# Patient Record
Sex: Male | Born: 1965 | Race: White | Hispanic: No | Marital: Married | Smoking: Never smoker
Health system: Southern US, Community
[De-identification: ages and names within clinical notes are randomized; demographics above are authoritative.]

## PROBLEM LIST (undated history)

## (undated) DIAGNOSIS — F32A Depression, unspecified: Secondary | ICD-10-CM

## (undated) DIAGNOSIS — H9190 Unspecified hearing loss, unspecified ear: Secondary | ICD-10-CM

## (undated) DIAGNOSIS — F419 Anxiety disorder, unspecified: Secondary | ICD-10-CM

## (undated) DIAGNOSIS — G4733 Obstructive sleep apnea (adult) (pediatric): Secondary | ICD-10-CM

## (undated) DIAGNOSIS — E7800 Pure hypercholesterolemia, unspecified: Secondary | ICD-10-CM

## (undated) DIAGNOSIS — G720 Drug-induced myopathy: Secondary | ICD-10-CM

## (undated) DIAGNOSIS — E785 Hyperlipidemia, unspecified: Secondary | ICD-10-CM

## (undated) DIAGNOSIS — G3184 Mild cognitive impairment, so stated: Secondary | ICD-10-CM

## (undated) DIAGNOSIS — R319 Hematuria, unspecified: Secondary | ICD-10-CM

## (undated) DIAGNOSIS — J45909 Unspecified asthma, uncomplicated: Secondary | ICD-10-CM

## (undated) DIAGNOSIS — D126 Benign neoplasm of colon, unspecified: Secondary | ICD-10-CM

## (undated) HISTORY — PX: HERNIA REPAIR: SHX51

## (undated) HISTORY — DX: Unspecified hearing loss, unspecified ear: H91.90

## (undated) HISTORY — DX: Anxiety disorder, unspecified: F41.9

## (undated) HISTORY — DX: Pure hypercholesterolemia, unspecified: E78.00

## (undated) HISTORY — PX: ORIF TIBIA FRACTURE: SHX5416

## (undated) HISTORY — DX: Hyperlipidemia, unspecified: E78.5

## (undated) HISTORY — DX: Mild cognitive impairment of uncertain or unknown etiology: G31.84

## (undated) HISTORY — DX: Drug-induced myopathy: G72.0

## (undated) HISTORY — DX: Unspecified asthma, uncomplicated: J45.909

## (undated) HISTORY — DX: Obstructive sleep apnea (adult) (pediatric): G47.33

## (undated) HISTORY — PX: CHOLECYSTECTOMY: SHX55

## (undated) HISTORY — DX: Hematuria, unspecified: R31.9

## (undated) HISTORY — DX: Depression, unspecified: F32.A

## (undated) HISTORY — DX: Benign neoplasm of colon, unspecified: D12.6

---

## 2000-08-20 ENCOUNTER — Encounter: Payer: Self-pay | Admitting: Emergency Medicine

## 2000-08-20 ENCOUNTER — Inpatient Hospital Stay (HOSPITAL_COMMUNITY): Admission: EM | Admit: 2000-08-20 | Discharge: 2000-08-24 | Payer: Self-pay | Admitting: Emergency Medicine

## 2000-08-21 ENCOUNTER — Encounter: Payer: Self-pay | Admitting: Orthopedic Surgery

## 2004-03-11 ENCOUNTER — Encounter: Admission: RE | Admit: 2004-03-11 | Discharge: 2004-03-11 | Payer: Self-pay | Admitting: Otolaryngology

## 2008-02-29 ENCOUNTER — Ambulatory Visit (HOSPITAL_COMMUNITY): Admission: RE | Admit: 2008-02-29 | Discharge: 2008-02-29 | Payer: Self-pay | Admitting: Surgery

## 2008-05-09 ENCOUNTER — Encounter: Admission: RE | Admit: 2008-05-09 | Discharge: 2008-05-09 | Payer: Self-pay | Admitting: Surgery

## 2010-07-29 NOTE — Op Note (Signed)
Jesus [name], PHIMMASONE             ACCOUNT NO.:  [id number]   MEDICAL RECORD NO.:  [id number]          PATIENT TYPE:  AMB   LOCATION:  DAY                          FACILITY:  [facility]   PHYSICIAN:  Wilmon Arms. Corliss Skains, M.D. DATE OF BIRTH:  Jul 28, 1965   DATE OF PROCEDURE:  02/29/2008  DATE OF DISCHARGE:                               OPERATIVE REPORT   PREOPERATIVE DIAGNOSIS:  Left inguinal hernia.   POSTOPERATIVE DIAGNOSIS:  Left inguinal hernia.   PROCEDURE PERFORMED:  Left inguinal hernia repair with mesh.   SURGEON:  Wilmon Arms. Tsuei, M.D.   ANESTHESIA:  General endotracheal.   INDICATIONS:  The patient is a 45 year old male who recently underwent a  laparoscopic cholecystectomy for significant biliary dyskinesia and  chronic cholecystitis.  When he was in his recovery period after his  gallbladder surgery he began having some discomfort in his left groin.  At his postop visit he was examined in the office and he was felt to  have a left inguinal hernia which is reducible.  The patient now  presents for elective repair.   DESCRIPTION OF PROCEDURE:  The patient was brought to the operating room  and placed in the supine position on the operating room table.  After an  adequate level of general anesthesia was obtained the patient's left  groin was shaved, prepped with Betadine and draped in a sterile fashion.  A time-out was taken to assure the proper patient and proper procedure.  The area above the left inguinal ligament was infiltrated with 0.25%  Marcaine with epinephrine.  An oblique incision was made here.  Dissection was carried down to the subcutaneous tissues with cautery.  The external oblique fascia was exposed.  We opened the fascia along the  direction of its fibers down to the external ring.  Bluntly dissected  around the spermatic cord and retracted it with a Penrose drain.  The  floor of the inguinal canal showed no direct defect, it was very lax and  was bulging.   We skeletonized the spermatic cord and a fairly large  indirect hernia sac was dissected free and reduced up to the internal  ring.  The internal ring was then tightened with a 2-0 Vicryl suture.  The floor of the inguinal canal was reinforced with a running zero PDS  suture.  Three x 6 Ultrapro mesh was then cut into keyhole shape and  secured into the pubic tubercle with 2-0 Prolene suture.  Tails were  sutured together behind the cord and were tucked underneath the external  oblique fascia.  We secured the mesh to the shelving edge inferiorly and  the internal oblique fascia superiorly.  The fascia was then  reapproximated with 2-0 Vicryl.  Vicryl 3-0 was used to close  subcutaneous tissues and 4-0 Monocryl was used to close the skin.  Steri-  Strips and clean dressings were applied.  The patient was then extubated  and brought to the recovery room in stable condition.  All sponge,  instrument and needle counts were correct.      Wilmon Arms. Tsuei, M.D.  Electronically Signed  MKT/MEDQ  D:  02/29/2008  T:  03/01/2008  Job:  [id number]

## 2010-08-01 NOTE — Discharge Summary (Signed)
[place]. [facility]  Patient:    Jesus [name], Jesus [name]                    MRN: [id number] Adm. Date:  [id number] Disc. Date: [id number] Attending:  Carolan Shiver Ii Dictator:   Jamelle Rushing, P.A.                           Discharge Summary  ADMISSION DIAGNOSIS:  Right comminuted mid-shaft tibial fracture.  DISCHARGE DIAGNOSES:  1. Irrigation and debridement and open reduction internal fixation with an     intramedullary nailing of his right mid-shaft tibial fracture.  2. History of depression.  3. Urinary retention.  HISTORY OF PRESENT ILLNESS:  The patient is a 45 year old male who was working on his roof and the ladder fell off the roof.  The patient got tired of waiting for someone to come and assist him so he jumped off the [place] roof.  The patient landed on his right leg predominantly, had immediate pain and deformity, and noted a large bleeding wound on the anterior aspect of his lower right extremity.  The patient was able to attract attention and was able to be brought to the [facility] Emergency room by EMS in a C-collar and long leg splint.  ALLERGIES:  No known drug allergies.  CURRENT MEDICATIONS: 1. Remeron 30 mg p.o. q.d. 2. Effexor 150 mg 1 p.o. b.i.d.  SURGICAL PROCEDURE:  August 20, 2000, the patient was taken to the operating room by Dr. Anthoney Harada Rendall and assisted by Jamelle Rushing, P.A.  Under gener al anesthesia the patient had an open comminuted fracture of his right tibia and fibula.  The patient had an I&D and a IM nailing of this fracture with a top normal nail with proximal interlocking screw.  The patient tolerated this procedure well.  The patient was transferred to the recovery room and then to the orthopedic floor for further treatment.  CONSULTS:  Occupational and physical therapy routine consults.  HOSPITAL COURSE:  On August 20, 2000, the patient was admitted to [facility] under the care of  John L. Dorothyann Gibbs, M.D.  The patient was taken to the operating room where an open tibia/fibular fracture was repaired with an IM nailing and irrigation and debridement of his open wound was performed. The patient tolerated these procedures well, was transferred to the recovery room, and then to the orthopedic floor for rehab.  The patient then had a three-day postoperative course in which the patient had a moderate amount of discomfort in his right lower extremity initially which was improved with PCA.  The leg remained neurovascularly/motor intact during the patients entire hospital course.  The patient pain did improve his second and third day postoperative course, but upon removal of his Foley catheter on postoperative day two the patient was unable to void.  He was in and out catheterized a multiple amount of times and found to have a significant amount of retained urine.  So he was then placed on some Urecholine and eventually he was able to avoid on his own without any further complications.  The patient was able to void on his own on postoperative day three.  His wound remained benign and his leg remained neurovascularly/motor intact, so he was in fact discharged to home on postoperative day three in good condition.  X-RAYS:  Chest x-ray on admission found no acute pulmonary  findings.  Cervical spine series on admission found no acute bony findings and normal alignment. Right two-view tib/fib on admission shows comminuted displaced tib/fib fractures.  Postoperative x-rays of his right lower extremity showed intramedullary rod has been placed for fixation of comminuted distal right tibial fracture; comminuted fracture of the right distal fibula is also noted.  LABORATORY:  On August 23, 2000, CBC:  WBC 9.8, hemoglobin 12.1, hematocrit 34.2, platelets 256.  Routine chemistries on admission:  Sodium 141, potassium 3.7, glucose 122, BUN 22, creatinine 1.1.  Wound cultures from right  leg showed no WBCs, no organisms noted after two days.  DISCHARGE MEDICATIONS:  1. Tylenol 650 mg p.o. q.8h. p.r.n.  2. Aspirin 325 mg p.o. q.d.  3. Flonase 1 puff q.d.  4. Urecholine 25 mg p.o. t.i.d.  5. Klonopin 0.5 mg p.o. b.i.d.  6. Vicodin 5 mg 1-2 tablets q.4-6h. p.r.n. pain.  7. Claritin 10 mg p.o. q.d.  8. Robaxin 500 mg p.o. q.6-8h. p.r.n. spasms.  9. Remeron 30 mg p.o. q.d. 10. Singulair 10 mg p.o. q.h.s. 11. Percocet 1-2 tablets p.o. q.4-6h. breakthrough pain not covered by     Vicodin. 12. Effexor 150 mg p.o. b.i.d.  DISCHARGE INSTRUCTIONS:  The patient is to continue routine medications:  1. Keflex 500 mg 1 tablet 4 x q.d. for next 7 days.  2. Darvocet-N 100 1-2 tablets q.4-6h. p.r.n. pain if needed.  3. Aspirin 325 mg once a day.  ACTIVITY:  As tolerated, no weightbearing on right foot, touchdown for balance only.  No driving.  DIET:  No restrictions.  WOUND CARE:  The patient is to keep cast clean and dry.  Check wound on knee daily for any signs of infection.  FOLLOWUP:  The patient is to call [phone] for a followup appointment with Dr. Priscille Kluver this following Thursday.  CONDITION ON DISCHARGE:  Good. DD:  09/11/00 TD:  09/11/00 Job: [id number]

## 2010-08-01 NOTE — Op Note (Signed)
[place]. [facility]  Patient:    Jesus [name], Jesus [name]                    MRN: [id number] Proc. Date: 08/20/00 Adm. Date:  [id number] Attending:  Carolan Shiver Ii                           Operative Report  PREOPERATIVE DIAGNOSIS:  Open, comminuted fracture, right tibia and fibula.  SURGICAL PROCEDURE:  Irrigation and debridement, open tib-fib fracture, with IM rod, Top-normal nail with proximal interlock.  POSTOPERATIVE DIAGNOSIS:  Open, comminuted fracture, right tibia and fibula.  SURGEON:  John L. Margaretmary Lombard, M.D.  ASSISTANT:  Jamelle Rushing, P.A.  ANESTHESIA:  General.  PATHOLOGY:  The patient has a comminuted distal third tibia fracture with an open wound inside out, in which there is dirt on the leg as he landed in the dirt, but no gross dirt was seen on the spike of bone that passed through the skin.  PROCEDURE:  Under general anesthesia, the right leg was prepared with Betadine and draped as a sterile field.  The laceration in the skin was jagged at about 3 cm in length, and the skin edges were excised.  At this point, the fractured tibia was exposed.  The bone ends were Re-injured away.  Three liters of pressure irrigation was done into the break.  At this point, a mediosuperior incision was made, medial parapatellar, and the Redrafting tendon tibia was exposed beneath the fat pad.  A rasp was used to make a hole in the bone, and canal finders were passed.  A 12 mm step reamer was passed, and then an IM rod was passed down the proximal tibia, across the fracture site into the distal tibia.  The length of the tibia was measured at 34 mm.  At this point, it was necessary to pass sounds to see the diameter of the canal of the tibia.  An 8 mm passed relatively easily.  A 9 mm passed, but the 10 hung up fairly quickly.  The decision was made to use an 8 x 34.5 mm Ace IM nail.  This was passed across the fracture, and one locking screw was  placed from anteromedially.  The tibia fracture was felt to be Rational stable with this in place, and at this point, further Sabering of the wounds was done.  Two 0.25-inch Penrose drains were placed, one proximally and distally, at the site of the open wound.  The part that was opened was closed with skin staples. The original opening had skin sutures put in place but not pulled together, and the proximal IM nailing site was closed with 2-0 Vicryl and skin clips.  A sterile dressing and posterior short leg splint were applied.  The patient returned to recovery in good condition. DD:  08/21/00 TD:  08/22/00 Job: [id number]

## 2010-12-19 LAB — BASIC METABOLIC PANEL
BUN: 14 mg/dL (ref 6–23)
Calcium: 9.4 mg/dL (ref 8.4–10.5)
GFR calc non Af Amer: >60 mL/min (ref >60)
Glucose, Bld: 114 mg/dL — ABNORMAL HIGH (ref 70–99)
Potassium: 3.9 mEq/L (ref 3.5–5.1)

## 2010-12-19 LAB — DIFFERENTIAL
Basophils Absolute: 0 10*3/uL (ref 0.0–0.1)
Eosinophils Absolute: 0.1 10*3/uL (ref 0.0–0.7)
Eosinophils Relative: 2 % (ref 0–5)
Lymphocytes Relative: 27 % (ref 12–46)

## 2010-12-19 LAB — CBC
HCT: 39.4 % (ref 39.0–52.0)
Platelets: 267 10*3/uL (ref 150–400)
RDW: 12.6 % (ref 11.5–15.5)

## 2014-08-23 ENCOUNTER — Ambulatory Visit
Admission: RE | Admit: 2014-08-23 | Discharge: 2014-08-23 | Disposition: A | Discharge status: Home / Self Care | Payer: BC Managed Care – PPO | Source: Ambulatory Visit | Attending: Family Medicine | Admitting: Family Medicine

## 2014-08-23 ENCOUNTER — Other Ambulatory Visit: Payer: Self-pay | Admitting: Family Medicine

## 2014-08-23 DIAGNOSIS — R519 Headache, unspecified: Secondary | ICD-10-CM

## 2014-08-23 DIAGNOSIS — R51 Headache: Principal | ICD-10-CM

## 2014-08-23 DIAGNOSIS — S060X0A Concussion without loss of consciousness, initial encounter: Secondary | ICD-10-CM

## 2014-08-28 ENCOUNTER — Other Ambulatory Visit: Payer: Self-pay | Admitting: Family Medicine

## 2014-08-28 DIAGNOSIS — S060X9S Concussion with loss of consciousness of unspecified duration, sequela: Secondary | ICD-10-CM

## 2014-08-29 ENCOUNTER — Ambulatory Visit
Admission: RE | Admit: 2014-08-29 | Discharge: 2014-08-29 | Disposition: A | Discharge status: Home / Self Care | Payer: BC Managed Care – PPO | Source: Ambulatory Visit | Attending: Family Medicine | Admitting: Family Medicine

## 2014-08-29 DIAGNOSIS — S060X9S Concussion with loss of consciousness of unspecified duration, sequela: Secondary | ICD-10-CM

## 2014-10-04 ENCOUNTER — Ambulatory Visit (INDEPENDENT_AMBULATORY_CARE_PROVIDER_SITE_OTHER): Payer: BC Managed Care – PPO | Admitting: Neurology

## 2014-10-04 ENCOUNTER — Encounter: Payer: Self-pay | Admitting: Neurology

## 2014-10-04 VITALS — BP 136/80 | HR 72 | Resp 16 | Ht 72.0 in | Wt 209.0 lb

## 2014-10-04 DIAGNOSIS — F0781 Postconcussional syndrome: Secondary | ICD-10-CM

## 2014-10-04 DIAGNOSIS — F411 Generalized anxiety disorder: Secondary | ICD-10-CM | POA: Diagnosis not present

## 2014-10-04 MED ORDER — AMITRIPTYLINE HCL 10 MG PO TABS: ORAL_TABLET | ORAL | Status: DC

## 2014-10-04 NOTE — Patient Instructions (Signed)
1. Start amitriptyline : Take 1 tablet at night for 1 week, then increase to 2 tablets at night 2. Recommend starting therapy for anxiety 3. Physical and brain rest are of utmost importance after a concussion 4. Follow-up in 3 weeks, call for any problems
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
## 2014-10-07 ENCOUNTER — Encounter: Payer: Self-pay | Admitting: Neurology

## 2014-10-07 DIAGNOSIS — F411 Generalized anxiety disorder: Secondary | ICD-10-CM | POA: Insufficient documentation

## 2014-10-24 ENCOUNTER — Encounter: Payer: Self-pay | Admitting: Neurology

## 2014-10-24 ENCOUNTER — Ambulatory Visit (INDEPENDENT_AMBULATORY_CARE_PROVIDER_SITE_OTHER): Payer: BC Managed Care – PPO | Admitting: Neurology

## 2014-10-24 VITALS — BP 122/88 | HR 88 | Ht 70.0 in | Wt 206.0 lb

## 2014-10-24 DIAGNOSIS — F411 Generalized anxiety disorder: Secondary | ICD-10-CM | POA: Diagnosis not present

## 2014-10-24 DIAGNOSIS — F0781 Postconcussional syndrome: Secondary | ICD-10-CM | POA: Diagnosis not present

## 2014-10-24 MED ORDER — AMITRIPTYLINE HCL 10 MG PO TABS: ORAL_TABLET | ORAL | Status: DC

## 2014-10-24 NOTE — Patient Instructions (Signed)
1. Continue with psychotherapy, discuss Cognitive Behavioral Therapy with Mr. Jesus [name] 2. Increase amitriptyline : take 3 tabs at bedtime 3. Continue with physical and cognitive rest 4. Follow-up in 2 months
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
## 2014-10-25 ENCOUNTER — Ambulatory Visit: Payer: BC Managed Care – PPO | Admitting: Neurology

## 2014-10-29 ENCOUNTER — Telehealth: Payer: Self-pay | Admitting: Neurology

## 2014-10-29 NOTE — Telephone Encounter (Signed)
Returned call. Told him that form wasn't ready yet & I would call him when form was completed. Explained that we do have a 5-7 day turn around for form completion.

## 2014-10-29 NOTE — Telephone Encounter (Signed)
Pt was checking to see if his short term disability forms are ready for pick up? Call back @  [phone]

## 2014-12-26 ENCOUNTER — Ambulatory Visit (INDEPENDENT_AMBULATORY_CARE_PROVIDER_SITE_OTHER): Payer: BC Managed Care – PPO | Admitting: Neurology

## 2014-12-26 ENCOUNTER — Encounter: Payer: Self-pay | Admitting: Neurology

## 2014-12-26 VITALS — BP 122/92 | HR 82 | Ht 70.0 in | Wt 214.0 lb

## 2014-12-26 DIAGNOSIS — F411 Generalized anxiety disorder: Secondary | ICD-10-CM

## 2014-12-26 DIAGNOSIS — F0781 Postconcussional syndrome: Secondary | ICD-10-CM | POA: Diagnosis not present

## 2014-12-26 MED ORDER — AMITRIPTYLINE HCL 25 MG PO TABS: ORAL_TABLET | ORAL | Status: DC
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
## 2014-12-26 NOTE — Patient Instructions (Signed)
1. Increase amitriptyline 10mg : Take 4 capsules at night. Once you finish your current bottle, your new prescription will be for amitriptyline 25mg : Take 2 tabs at night 2. Call Dr. Cliffton AstersWhite for refills on Xanax until you see Dr. Evelene CroonKaur 3. Refer to Dr. Evelene CroonKaur for treatment of anxiety 4. Follow-up in 2 months

## 2015-02-12 ENCOUNTER — Telehealth: Payer: Self-pay | Admitting: Neurology

## 2015-02-12 NOTE — Telephone Encounter (Signed)
VM-PT left message in regards to some disability forms/Dawn CB# [phone]

## 2015-02-12 NOTE — Telephone Encounter (Signed)
Patient stopped by the office and I spoke with him in person about disability forms. Patient picked up forms he had dropped off last week as they were the wrong forms. He is going to get the correct short term disability forms and drop them off.

## 2015-02-25 ENCOUNTER — Encounter: Payer: Self-pay | Admitting: Neurology

## 2015-02-25 ENCOUNTER — Ambulatory Visit (INDEPENDENT_AMBULATORY_CARE_PROVIDER_SITE_OTHER): Payer: BC Managed Care – PPO | Admitting: Neurology

## 2015-02-25 VITALS — BP 132/78 | HR 98 | Ht 70.0 in | Wt 218.0 lb

## 2015-02-25 DIAGNOSIS — F4310 Post-traumatic stress disorder, unspecified: Secondary | ICD-10-CM | POA: Diagnosis not present

## 2015-02-25 DIAGNOSIS — F0781 Postconcussional syndrome: Secondary | ICD-10-CM | POA: Diagnosis not present

## 2015-02-25 DIAGNOSIS — F411 Generalized anxiety disorder: Secondary | ICD-10-CM | POA: Diagnosis not present

## 2015-02-25 MED ORDER — AMITRIPTYLINE HCL 25 MG PO TABS: ORAL_TABLET | ORAL | Status: DC
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
## 2015-02-25 NOTE — Patient Instructions (Addendum)
1. Reduce amitriptyline 25mg : take 1 tablet at night 2. Please call our office to let us know when start of spring semester is 3. Continue Propranolol and Xanax as prescribed by Dr. Evelene CroonKaur 4. Continue working with psychiatry and therapy 5. Recommend gradual return to regular daily activities 6. Follow-up in 4 months, call for any changes

## 2015-03-08 ENCOUNTER — Telehealth: Payer: Self-pay | Admitting: Neurology

## 2015-03-08 NOTE — Telephone Encounter (Signed)
PT called and wanted to know if the disability paper work was ready for pick up/Dawn CB# [phone]

## 2015-03-12 ENCOUNTER — Telehealth: Payer: Self-pay | Admitting: Neurology

## 2015-03-12 NOTE — Telephone Encounter (Signed)
PT called and asked if the paperwork was ready that he dropped off/Dawn CB# [phone] or [phone]

## 2015-03-12 NOTE — Telephone Encounter (Signed)
Pt wanting to know the status of the paper work please call [phone]

## 2015-03-12 NOTE — Telephone Encounter (Signed)
Do you still have his paperwork?

## 2015-03-13 NOTE — Telephone Encounter (Signed)
Called patient and lmovm that forms were ready, we could fax or he could pick them up. Also notified of $25.00 from completion fee.

## 2015-03-13 NOTE — Telephone Encounter (Signed)
Done, thanks

## 2015-03-14 DIAGNOSIS — Z0279 Encounter for issue of other medical certificate: Secondary | ICD-10-CM

## 2015-04-22 ENCOUNTER — Telehealth: Payer: Self-pay | Admitting: Neurology

## 2015-04-22 NOTE — Telephone Encounter (Signed)
Returned call. Patient states he had called to make sure we received his paperwork. I did tell him that we did get his forms, I will call him once forms are completed.

## 2015-04-22 NOTE — Telephone Encounter (Signed)
PT called and wanted to know if the disability paperwork was ready yet/Dawn  CB# [phone]

## 2015-04-29 ENCOUNTER — Telehealth: Payer: Self-pay | Admitting: Neurology
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
## 2015-04-30 ENCOUNTER — Telehealth: Payer: Self-pay | Admitting: Neurology

## 2015-04-30 DIAGNOSIS — Z0279 Encounter for issue of other medical certificate: Secondary | ICD-10-CM

## 2015-04-30 NOTE — Telephone Encounter (Signed)
Called patient and left msg that forms were ready for p/u & $25 form fee would need to be paid.

## 2015-04-30 NOTE — Telephone Encounter (Signed)
PT called and was asking if the paperwork was ready for pick up yet/Dawn [phone]

## 2015-06-17 ENCOUNTER — Telehealth: Payer: Self-pay | Admitting: Neurology

## 2015-06-17 NOTE — Telephone Encounter (Signed)
Pat called and asked if paperwork is ready for pick up.  Please call.

## 2015-06-18 NOTE — Telephone Encounter (Signed)
Called patient to let him know that he can pick up his form tomorrow morning.

## 2015-07-01 ENCOUNTER — Ambulatory Visit (INDEPENDENT_AMBULATORY_CARE_PROVIDER_SITE_OTHER): Payer: BC Managed Care – PPO | Admitting: Neurology

## 2015-07-01 ENCOUNTER — Encounter: Payer: Self-pay | Admitting: Neurology

## 2015-07-01 VITALS — BP 120/86 | HR 79 | Ht 70.0 in | Wt 217.0 lb

## 2015-07-01 DIAGNOSIS — F411 Generalized anxiety disorder: Secondary | ICD-10-CM

## 2015-07-01 DIAGNOSIS — F0781 Postconcussional syndrome: Secondary | ICD-10-CM | POA: Diagnosis not present

## 2015-07-01 NOTE — Patient Instructions (Signed)
1. Continue all your medications 2. Continue working with Dr. Evelene CroonKaur and your therapist 3. Follow-up in 5 months
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
## 2015-07-02 ENCOUNTER — Encounter: Payer: Self-pay | Admitting: Neurology

## 2015-12-02 ENCOUNTER — Ambulatory Visit (INDEPENDENT_AMBULATORY_CARE_PROVIDER_SITE_OTHER): Payer: BC Managed Care – PPO | Admitting: Neurology

## 2015-12-02 ENCOUNTER — Encounter: Payer: Self-pay | Admitting: Neurology

## 2015-12-02 VITALS — BP 144/84 | HR 87 | Temp 98.0°F | Ht 70.0 in | Wt 214.4 lb

## 2015-12-02 DIAGNOSIS — F0781 Postconcussional syndrome: Secondary | ICD-10-CM | POA: Diagnosis not present

## 2015-12-02 DIAGNOSIS — F411 Generalized anxiety disorder: Secondary | ICD-10-CM
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
## 2015-12-02 NOTE — Patient Instructions (Signed)
You continue to look better and better! Follow-up in 6 months, call for any changes
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
## 2016-06-17 ENCOUNTER — Ambulatory Visit (INDEPENDENT_AMBULATORY_CARE_PROVIDER_SITE_OTHER): Payer: BC Managed Care – PPO | Admitting: Neurology

## 2016-06-17 ENCOUNTER — Encounter: Payer: Self-pay | Admitting: Neurology

## 2016-06-17 VITALS — BP 110/76 | HR 81 | Temp 98.3°F | Resp 16 | Ht 70.0 in | Wt 203.4 lb

## 2016-06-17 DIAGNOSIS — F0781 Postconcussional syndrome: Secondary | ICD-10-CM | POA: Diagnosis not present

## 2016-06-17 NOTE — Patient Instructions (Signed)
1. Continue follow-up with Dr. Evelene Croon 2. Continue to monitor headaches, if worsening call our office 3. Follow-up in 1 year, call for any changes
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
## 2016-06-18 NOTE — Progress Notes (Signed)
Forward follow up office note to Dr. Evelene Croon.

## 2017-06-18 ENCOUNTER — Ambulatory Visit: Payer: BC Managed Care – PPO | Admitting: Neurology

## 2017-06-18 ENCOUNTER — Other Ambulatory Visit: Payer: Self-pay

## 2017-06-18 ENCOUNTER — Encounter: Payer: Self-pay | Admitting: Neurology

## 2017-06-18 VITALS — BP 106/70 | HR 62 | Ht 70.0 in | Wt 208.0 lb

## 2017-06-18 DIAGNOSIS — F411 Generalized anxiety disorder: Secondary | ICD-10-CM | POA: Diagnosis not present

## 2017-06-18 DIAGNOSIS — F0781 Postconcussional syndrome: Secondary | ICD-10-CM | POA: Diagnosis not present
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
## 2017-06-18 NOTE — Patient Instructions (Signed)
Repeat Neurocognitive testing can be done to compare to prior test. Continue follow-up with Dr. Evelene CroonKaur and Evette CristalKenneth Frazier. Follow-up in 1 year, call for any changes.

## 2018-04-19 ENCOUNTER — Telehealth: Payer: Self-pay

## 2018-04-19 NOTE — Telephone Encounter (Signed)
Opened in error

## 2018-06-15 ENCOUNTER — Other Ambulatory Visit: Payer: Self-pay

## 2018-06-15 ENCOUNTER — Telehealth (INDEPENDENT_AMBULATORY_CARE_PROVIDER_SITE_OTHER): Payer: BC Managed Care – PPO | Admitting: Neurology

## 2018-06-15 ENCOUNTER — Telehealth: Payer: Self-pay | Admitting: Neurology

## 2018-06-15 DIAGNOSIS — E785 Hyperlipidemia, unspecified: Secondary | ICD-10-CM | POA: Diagnosis not present

## 2018-06-15 NOTE — Telephone Encounter (Signed)
Patient called back to give blood pressure and pulse rate after e visit  Blood pressure 134/91 and pulse 61
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
## 2018-06-20 ENCOUNTER — Ambulatory Visit: Payer: BC Managed Care – PPO | Admitting: Neurology

## 2019-06-15 ENCOUNTER — Ambulatory Visit: Payer: BC Managed Care – PPO | Admitting: Neurology

## 2019-06-15 ENCOUNTER — Ambulatory Visit: Payer: BC Managed Care – PPO | Attending: Internal Medicine

## 2019-06-15 DIAGNOSIS — Z23 Encounter for immunization: Secondary | ICD-10-CM

## 2019-06-15 NOTE — Progress Notes (Signed)
   Covid-19 Vaccination Clinic  Name:  KEAHI MCCARNEY    MRN: [id number] DOB: 03-31-1965  06/15/2019  Mr. Livsey was observed post Covid-19 immunization for 15 minutes without incident. He was provided with Vaccine Information Sheet and instruction to access the V-Safe system.   Mr. Cass was instructed to call 911 with any severe reactions post vaccine: [name] Difficulty breathing  . Swelling of face and throat  . A fast heartbeat  . A bad rash all over body  . Dizziness and weakness   Immunizations Administered    Name Date Dose VIS Date Route   Pfizer COVID-19 Vaccine 06/15/2019  1:14 PM 0.3 mL 02/24/2019 Intramuscular   Manufacturer: [employer], [employer]   Lot: [id number]   NDC: [address]-9

## 2019-07-10 ENCOUNTER — Ambulatory Visit: Payer: BC Managed Care – PPO | Attending: Internal Medicine

## 2019-07-10 DIAGNOSIS — Z23 Encounter for immunization: Secondary | ICD-10-CM

## 2019-07-10 NOTE — Progress Notes (Signed)
   Covid-19 Vaccination Clinic  Name:  Jesus [name]    MRN: [id number] DOB: Apr 28, 1965  07/10/2019  Jesus [name] was observed post Covid-19 immunization for 15 minutes without incident. He was provided with Vaccine Information Sheet and instruction to access the V-Safe system.   Jesus [name] was instructed to call 911 with any severe reactions post vaccine: [name] Difficulty breathing  . Swelling of face and throat  . A fast heartbeat  . A bad rash all over body  . Dizziness and weakness   Immunizations Administered    Name Date Dose VIS Date Route   Pfizer COVID-19 Vaccine 07/10/2019  1:07 PM 0.3 mL 05/10/2018 Intramuscular   Manufacturer: [employer], [employer]   Lot: [id number]   NDC: [address]-8

## 2021-12-19 DIAGNOSIS — F4322 Adjustment disorder with anxiety: Secondary | ICD-10-CM | POA: Diagnosis not present

## 2021-12-26 DIAGNOSIS — G4733 Obstructive sleep apnea (adult) (pediatric): Secondary | ICD-10-CM | POA: Diagnosis not present

## 2022-01-05 DIAGNOSIS — Z23 Encounter for immunization: Secondary | ICD-10-CM | POA: Diagnosis not present

## 2022-01-06 DIAGNOSIS — G4733 Obstructive sleep apnea (adult) (pediatric): Secondary | ICD-10-CM | POA: Diagnosis not present

## 2022-01-16 DIAGNOSIS — F4322 Adjustment disorder with anxiety: Secondary | ICD-10-CM | POA: Diagnosis not present

## 2022-03-04 DIAGNOSIS — Z961 Presence of intraocular lens: Secondary | ICD-10-CM | POA: Diagnosis not present

## 2022-03-04 DIAGNOSIS — H11443 Conjunctival cysts, bilateral: Secondary | ICD-10-CM | POA: Diagnosis not present

## 2022-03-20 DIAGNOSIS — F4310 Post-traumatic stress disorder, unspecified: Secondary | ICD-10-CM | POA: Diagnosis not present

## 2022-03-20 DIAGNOSIS — F900 Attention-deficit hyperactivity disorder, predominantly inattentive type: Secondary | ICD-10-CM | POA: Diagnosis not present

## 2022-07-06 ENCOUNTER — Ambulatory Visit: Payer: PPO | Admitting: Psychiatry

## 2022-09-30 DIAGNOSIS — K5900 Constipation, unspecified: Secondary | ICD-10-CM | POA: Diagnosis not present

## 2022-09-30 DIAGNOSIS — Z0000 Encounter for general adult medical examination without abnormal findings: Secondary | ICD-10-CM | POA: Diagnosis not present

## 2022-09-30 DIAGNOSIS — J301 Allergic rhinitis due to pollen: Secondary | ICD-10-CM | POA: Diagnosis not present

## 2022-09-30 DIAGNOSIS — Z9989 Dependence on other enabling machines and devices: Secondary | ICD-10-CM | POA: Diagnosis not present

## 2022-09-30 DIAGNOSIS — F419 Anxiety disorder, unspecified: Secondary | ICD-10-CM | POA: Diagnosis not present

## 2022-09-30 DIAGNOSIS — G3184 Mild cognitive impairment, so stated: Secondary | ICD-10-CM | POA: Diagnosis not present

## 2022-09-30 DIAGNOSIS — R1310 Dysphagia, unspecified: Secondary | ICD-10-CM | POA: Diagnosis not present

## 2022-09-30 DIAGNOSIS — G4733 Obstructive sleep apnea (adult) (pediatric): Secondary | ICD-10-CM | POA: Diagnosis not present

## 2022-09-30 DIAGNOSIS — E785 Hyperlipidemia, unspecified: Secondary | ICD-10-CM | POA: Diagnosis not present

## 2022-09-30 DIAGNOSIS — H9193 Unspecified hearing loss, bilateral: Secondary | ICD-10-CM | POA: Diagnosis not present

## 2022-09-30 DIAGNOSIS — Z125 Encounter for screening for malignant neoplasm of prostate: Secondary | ICD-10-CM | POA: Diagnosis not present

## 2022-10-20 DIAGNOSIS — H9313 Tinnitus, bilateral: Secondary | ICD-10-CM | POA: Diagnosis not present

## 2022-10-20 DIAGNOSIS — Z822 Family history of deafness and hearing loss: Secondary | ICD-10-CM | POA: Diagnosis not present

## 2022-10-20 DIAGNOSIS — H903 Sensorineural hearing loss, bilateral: Secondary | ICD-10-CM | POA: Diagnosis not present

## 2022-11-18 DIAGNOSIS — E785 Hyperlipidemia, unspecified: Secondary | ICD-10-CM | POA: Diagnosis not present

## 2022-11-18 DIAGNOSIS — Z79899 Other long term (current) drug therapy: Secondary | ICD-10-CM | POA: Diagnosis not present

## 2023-01-12 DIAGNOSIS — G4733 Obstructive sleep apnea (adult) (pediatric): Secondary | ICD-10-CM | POA: Diagnosis not present

## 2023-01-12 DIAGNOSIS — Z79899 Other long term (current) drug therapy: Secondary | ICD-10-CM | POA: Diagnosis not present

## 2023-01-12 DIAGNOSIS — E785 Hyperlipidemia, unspecified: Secondary | ICD-10-CM | POA: Diagnosis not present

## 2023-01-18 DIAGNOSIS — R319 Hematuria, unspecified: Secondary | ICD-10-CM | POA: Diagnosis not present

## 2023-01-18 DIAGNOSIS — R300 Dysuria: Secondary | ICD-10-CM | POA: Diagnosis not present

## 2023-03-15 DIAGNOSIS — H11443 Conjunctival cysts, bilateral: Secondary | ICD-10-CM | POA: Diagnosis not present

## 2023-03-15 DIAGNOSIS — Z961 Presence of intraocular lens: Secondary | ICD-10-CM | POA: Diagnosis not present

## 2023-03-18 DIAGNOSIS — R310 Gross hematuria: Secondary | ICD-10-CM | POA: Diagnosis not present

## 2023-04-08 ENCOUNTER — Other Ambulatory Visit (HOSPITAL_BASED_OUTPATIENT_CLINIC_OR_DEPARTMENT_OTHER): Payer: Self-pay | Admitting: Family Medicine

## 2023-04-08 DIAGNOSIS — E785 Hyperlipidemia, unspecified: Secondary | ICD-10-CM

## 2023-04-09 DIAGNOSIS — N281 Cyst of kidney, acquired: Secondary | ICD-10-CM | POA: Diagnosis not present

## 2023-04-09 DIAGNOSIS — R310 Gross hematuria: Secondary | ICD-10-CM | POA: Diagnosis not present

## 2023-04-16 DIAGNOSIS — E785 Hyperlipidemia, unspecified: Secondary | ICD-10-CM | POA: Diagnosis not present

## 2023-04-26 ENCOUNTER — Ambulatory Visit (HOSPITAL_COMMUNITY)
Admission: RE | Admit: 2023-04-26 | Discharge: 2023-04-26 | Disposition: A | Discharge status: Home / Self Care | Payer: Self-pay | Source: Ambulatory Visit | Attending: Family Medicine | Admitting: Family Medicine

## 2023-04-26 DIAGNOSIS — E785 Hyperlipidemia, unspecified: Secondary | ICD-10-CM

## 2023-05-05 ENCOUNTER — Ambulatory Visit: Payer: PPO | Attending: Cardiology | Admitting: Cardiology

## 2023-05-05 ENCOUNTER — Encounter: Payer: Self-pay | Admitting: *Deleted

## 2023-05-05 ENCOUNTER — Encounter: Payer: Self-pay | Admitting: Cardiology

## 2023-05-05 VITALS — BP 130/80 | HR 67 | Resp 16 | Ht 70.0 in | Wt 209.8 lb

## 2023-05-05 DIAGNOSIS — R931 Abnormal findings on diagnostic imaging of heart and coronary circulation: Secondary | ICD-10-CM

## 2023-05-05 DIAGNOSIS — E782 Mixed hyperlipidemia: Secondary | ICD-10-CM | POA: Diagnosis not present

## 2023-05-05 DIAGNOSIS — E673 Hypervitaminosis D: Secondary | ICD-10-CM | POA: Diagnosis not present

## 2023-05-05 MED ORDER — EZETIMIBE 10 MG PO TABS: 10.0000 mg | ORAL_TABLET | Freq: Every day | ORAL | 3 refills | Status: DC

## 2023-05-05 MED ORDER — ATORVASTATIN CALCIUM 20 MG PO TABS: 20.0000 mg | ORAL_TABLET | Freq: Every day | ORAL | 2 refills | Status: DC

## 2023-05-05 MED ORDER — ASPIRIN 81 MG PO CHEW: 81.0000 mg | CHEWABLE_TABLET | Freq: Every day | ORAL | Status: DC

## 2023-05-05 NOTE — Patient Instructions (Signed)
Medication Instructions:   Start Atorvastatin 20 mg by mouth daily  Start Zetia 10 mg by mouth daily  Start aspirin 81 mg by mouth daily  *If you need a refill on your cardiac medications before your next appointment, please call your pharmacy*   Lab Work: Have lab work checked at [facility]  on the first floor today (Vitamin D) Have lab work checked a few days prior to appointment with Dr Jacinto Halim.  This will be fasting.  Lipid profile.  Can be done at any [facility] If you have labs (blood work) drawn today and your tests are completely normal, you will receive your results only by: MyChart Message (if you have MyChart) OR A paper copy in the mail If you have any lab test that is abnormal or we need to change your treatment, we will call you to review the results.   Testing/Procedures: Your physician has requested that you have an exercise stress myoview. For further information please visit [email or web address] Please follow instruction sheet, as given.    Follow-Up: At [facility], you and your health needs are our priority.  As part of our continuing mission to provide you with exceptional heart care, we have created designated Provider Care Teams.  These Care Teams include your primary Cardiologist (physician) and Advanced Practice Providers (APPs -  Physician Assistants and Nurse Practitioners) who all work together to provide you with the care you need, when you need it.  We recommend signing up for the patient portal called "MyChart".  Sign up information is provided on this After Visit Summary.  MyChart is used to connect with patients for Virtual Visits (Telemedicine).  Patients are able to view lab/test results, encounter notes, upcoming appointments, etc.  Non-urgent messages can be sent to your provider as well.   To learn more about what you can do with MyChart, go to [email or web address].    Your next appointment:   10 week(s)  Provider:   Yates Decamp, MD     Other  Instructions
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
## 2023-05-06 ENCOUNTER — Encounter: Payer: Self-pay | Admitting: Cardiology

## 2023-05-06 LAB — VITAMIN D 25 HYDROXY (VIT D DEFICIENCY, FRACTURES): Vit D, 25-Hydroxy: 45.9 ng/mL (ref 30.0–100.0)

## 2023-05-06 NOTE — Progress Notes (Signed)
Normal Vit D. He should probably add CoQ 10 supplement to see if myalgia would be helped with statin if he has not done so.

## 2023-05-07 ENCOUNTER — Telehealth: Payer: Self-pay | Admitting: Cardiology

## 2023-05-07 ENCOUNTER — Encounter (HOSPITAL_COMMUNITY): Payer: Self-pay

## 2023-05-07 NOTE — Telephone Encounter (Signed)
Patient  is requesting all results and messages given to him via phone call and not through MyChart.

## 2023-05-07 NOTE — Telephone Encounter (Signed)
Spoke with patient and he stated that communicating through [place] is fine. He doesn't like text messages. States he received a text to give [place] a call. He states he does not check his text messages often

## 2023-05-14 ENCOUNTER — Ambulatory Visit (HOSPITAL_COMMUNITY): Payer: PPO | Attending: Cardiology

## 2023-05-14 DIAGNOSIS — R931 Abnormal findings on diagnostic imaging of heart and coronary circulation: Secondary | ICD-10-CM | POA: Diagnosis present

## 2023-05-14 DIAGNOSIS — E782 Mixed hyperlipidemia: Secondary | ICD-10-CM | POA: Insufficient documentation

## 2023-05-14 DIAGNOSIS — R9431 Abnormal electrocardiogram [ECG] [EKG]: Secondary | ICD-10-CM | POA: Insufficient documentation

## 2023-05-14 LAB — MYOCARDIAL PERFUSION IMAGING
Angina Index: 0
Duke Treadmill Score: -10
Estimated workload: 7
Exercise duration (min): 5 min
Exercise duration (sec): 1 s
LV dias vol: 89 mL (ref 62–150)
LV sys vol: 50 mL
MPHR: 161 {beats}/min
Nuc Stress EF: 44 %
Peak HR: 148 {beats}/min
Percent HR: 91 %
Rest HR: 73 {beats}/min
Rest Nuclear Isotope Dose: 10.1 mCi
SDS: 2
SRS: 0
SSS: 2
ST Depression (mm): 3 mm
Stress Nuclear Isotope Dose: 32.7 mCi
TID: 1.03

## 2023-05-14 MED ORDER — TECHNETIUM TC 99M TETROFOSMIN IV KIT
10.1000 | PACK | Freq: Once | INTRAVENOUS | Status: AC | PRN
  Administered 2023-05-14: 10.1 via INTRAVENOUS

## 2023-05-14 MED ORDER — TECHNETIUM TC 99M TETROFOSMIN IV KIT
32.7000 | PACK | Freq: Once | INTRAVENOUS | Status: AC | PRN
  Administered 2023-05-14: 32.7 via INTRAVENOUS

## 2023-05-15 ENCOUNTER — Encounter: Payer: Self-pay | Admitting: Cardiology

## 2023-05-15 NOTE — Progress Notes (Signed)
 Abnormal EKG response and although perfusion is normal, I suspect ischemic cardiomyopathy. I want to schedule him for echo and I will discuss  if he should proceed with cardiac cath especially given his young age.  Please order Echo. Indication CAD with other forms of angina

## 2023-05-16 ENCOUNTER — Encounter: Payer: Self-pay | Admitting: Cardiology

## 2023-05-17 ENCOUNTER — Other Ambulatory Visit: Payer: Self-pay | Admitting: *Deleted

## 2023-05-17 DIAGNOSIS — I25118 Atherosclerotic heart disease of native coronary artery with other forms of angina pectoris: Secondary | ICD-10-CM

## 2023-05-18 ENCOUNTER — Ambulatory Visit (HOSPITAL_COMMUNITY): Attending: Cardiology

## 2023-05-18 ENCOUNTER — Encounter: Payer: Self-pay | Admitting: Cardiology

## 2023-05-18 DIAGNOSIS — I25118 Atherosclerotic heart disease of native coronary artery with other forms of angina pectoris: Secondary | ICD-10-CM | POA: Insufficient documentation

## 2023-05-18 LAB — ECHOCARDIOGRAM COMPLETE
Area-P 1/2: 2.91 cm2
S' Lateral: 3.2 cm

## 2023-05-18 NOTE — Progress Notes (Signed)
 Normal echocardiogram, very borderline dilatation of aortic root.

## 2023-05-21 ENCOUNTER — Encounter: Payer: Self-pay | Admitting: Cardiology

## 2023-05-21 NOTE — Telephone Encounter (Signed)
 Please schedule visit with me to follow uo on stress and echo in 3 weeks or so

## 2023-05-25 ENCOUNTER — Other Ambulatory Visit: Payer: Self-pay

## 2023-05-25 ENCOUNTER — Emergency Department (HOSPITAL_COMMUNITY)

## 2023-05-25 ENCOUNTER — Emergency Department (HOSPITAL_COMMUNITY): Admission: EM | Admit: 2023-05-25 | Discharge: 2023-05-26 | Disposition: A | Discharge status: Home / Self Care

## 2023-05-25 DIAGNOSIS — R079 Chest pain, unspecified: Secondary | ICD-10-CM | POA: Diagnosis not present

## 2023-05-25 DIAGNOSIS — R42 Dizziness and giddiness: Secondary | ICD-10-CM | POA: Insufficient documentation

## 2023-05-25 DIAGNOSIS — R0789 Other chest pain: Secondary | ICD-10-CM | POA: Diagnosis not present

## 2023-05-25 DIAGNOSIS — F419 Anxiety disorder, unspecified: Secondary | ICD-10-CM | POA: Diagnosis not present

## 2023-05-25 DIAGNOSIS — Z7982 Long term (current) use of aspirin: Secondary | ICD-10-CM | POA: Diagnosis not present

## 2023-05-25 DIAGNOSIS — I10 Essential (primary) hypertension: Secondary | ICD-10-CM | POA: Diagnosis not present

## 2023-05-25 LAB — BASIC METABOLIC PANEL
Anion gap: 11 (ref 5–15)
BUN: 9 mg/dL (ref 6–20)
CO2: 23 mmol/L (ref 22–32)
Calcium: 9.8 mg/dL (ref 8.9–10.3)
Chloride: 102 mmol/L (ref 98–111)
Creatinine, Ser: 1.08 mg/dL (ref 0.61–1.24)
GFR, Estimated: >60 mL/min (ref >60)
Glucose, Bld: 91 mg/dL (ref 70–99)
Potassium: 3.6 mmol/L (ref 3.5–5.1)
Sodium: 136 mmol/L (ref 135–145)

## 2023-05-25 LAB — CBC
HCT: 41 % (ref 39.0–52.0)
Hemoglobin: 14.6 g/dL (ref 13.0–17.0)
MCH: 32.9 pg (ref 26.0–34.0)
MCHC: 35.6 g/dL (ref 30.0–36.0)
MCV: 92.3 fL (ref 80.0–100.0)
Platelets: 241 10*3/uL (ref 150–400)
RBC: 4.44 MIL/uL (ref 4.22–5.81)
RDW: 11.5 % (ref 11.5–15.5)
WBC: 5.6 10*3/uL (ref 4.0–10.5)
nRBC: 0 % (ref 0.0–0.2)

## 2023-05-25 LAB — I-STAT CG4 LACTIC ACID, ED: Lactic Acid, Venous: 0.8 mmol/L (ref 0.5–1.9)

## 2023-05-25 LAB — TROPONIN I (HIGH SENSITIVITY): Troponin I (High Sensitivity): 4 ng/L (ref <18)

## 2023-05-25 NOTE — ED Provider Notes (Signed)
 [place] EMERGENCY DEPARTMENT AT [facility] Provider Note   CSN: [id number] Arrival date & time: 05/25/23  2100     History {Add pertinent medical, surgical, social history, OB history to HPI:1} Chief Complaint  Patient presents with   Chest Pain    Jesus [name] is a 58 y.o. male.  Patient with past medical history significant for HLD presenting for chest pain. Patient states that the pain began around 7pm shortly after taking the trash to the curb, which has never occurred before. He reports that this is the worst he has felt in his life and experienced anxiety and lightheadedness in conjunction with the pain. He states the pain was approximately left of center of his chest and characterized the pain as a "poking" sensation. He is unsure if sitting down helped alleviate the pain but feels leaning back may have provided some relief. Pain is rated a 5/10 during the initial episode and was rated 2/10 during the encounter. Endorses indigestion and heartburn that occurred throughout the day, as well as some confusion when answering questions from EMS. Patient was exercising yesterday (walking/moving wood) and did not experience any pain. Pertinent PMH history includes recent CAC scan with score of 1661 which he was put on lipid lowering medications, anxiety, and PTSD. As of one month ago the patient stopped drinking where he previously drank about one beer per day. Denies smoking or any other drug use.    Chest Pain      Home Medications Prior to Admission medications   Medication Sig Start Date End Date Taking? Authorizing Provider  ALPRAZolam Prudy Feeler) 0.25 MG tablet Take 0.25 mg by mouth at bedtime as needed for anxiety.    [provider]  aspirin (ASPIRIN CHILDRENS) 81 MG chewable tablet Chew 1 tablet (81 mg total) by mouth daily. 05/05/23   Yates Decamp, MD  atorvastatin (LIPITOR) 20 MG tablet Take 1 tablet (20 mg total) by mouth daily. 05/05/23 08/03/23  Yates Decamp,  MD  co-enzyme Q-10 30 MG capsule Take 30 mg by mouth 3 (three) times daily.    [provider]  ezetimibe (ZETIA) 10 MG tablet Take 1 tablet (10 mg total) by mouth daily. 05/05/23 08/03/23  Yates Decamp, MD  fluticasone (FLONASE) 50 MCG/ACT nasal spray Place 2 sprays into both nostrils as needed for allergies or rhinitis.    [provider]  loratadine (CLARITIN) 10 MG tablet Take 10 mg by mouth daily.    [provider]  propranolol (INDERAL) 20 MG tablet 3 (three) times daily. But usually takes once a day 04/12/17   [provider]      Allergies    Fluoxetine, Paroxetine, and Sertraline    Review of Systems   Review of Systems  Cardiovascular:  Positive for chest pain.    Physical Exam Updated Vital Signs BP (!) 158/92 (BP Location: Right Arm)   Pulse 82   Temp 98.2 F (36.8 C) (Oral)   Resp 18   Ht 5\' 10"  (1.778 m)   Wt 95.2 kg   SpO2 100%   BMI 30.10 kg/m  Physical Exam  ED Results / Procedures / Treatments   Labs (all labs ordered are listed, but only abnormal results are displayed) Labs Reviewed  BASIC METABOLIC PANEL  CBC  I-STAT CG4 LACTIC ACID, ED  TROPONIN I (HIGH SENSITIVITY)  TROPONIN I (HIGH SENSITIVITY)    EKG None  Radiology No results found.  Procedures Procedures  {Document cardiac monitor, telemetry assessment procedure  when appropriate:1}  Medications Ordered in ED Medications - No data to display  ED Course/ Medical Decision Making/ A&P   {   Click here for ABCD2, HEART and other calculatorsREFRESH Note before signing :1}                              Medical Decision Making Amount and/or Complexity of Data Reviewed Labs: ordered. Radiology: ordered.   ***  {Document critical care time when appropriate:1} {Document review of labs and clinical decision tools ie heart score, Chads2Vasc2 etc:1}  {Document your independent review of radiology images, and any outside records:1} {Document your  discussion with family members, caretakers, and with consultants:1} {Document social determinants of health affecting pt's care:1} {Document your decision making why or why not admission, treatments were needed:1} Final Clinical Impression(s) / ED Diagnoses Final diagnoses:  None    Rx / DC Orders ED Discharge Orders     None

## 2023-05-25 NOTE — Telephone Encounter (Signed)
 Pt spouse called in about this note. He was scheduled for 07/15/23 after last appt. I added him to waitlist today. She stated she thought this needed to be sooner, please advise.

## 2023-05-25 NOTE — Telephone Encounter (Signed)
 I spoke with patient and his wife and scheduled patient to see Dr Jacinto Halim on 4/8 at 11:40

## 2023-05-25 NOTE — ED Triage Notes (Addendum)
 Pt BIB [facility] d/t CP from home.  Pt suddenly while sitting got 5/10 CP - took 324 ASA at home.  Pt states he has been Anxiety all day and is seeing a Cardiologist but could not state why to EMS.

## 2023-05-26 ENCOUNTER — Encounter: Payer: Self-pay | Admitting: Cardiology

## 2023-05-26 LAB — TROPONIN I (HIGH SENSITIVITY): Troponin I (High Sensitivity): 4 ng/L (ref <18)

## 2023-05-26 NOTE — ED Notes (Signed)
 Pt ambulated to and from the bathroom without difficulty continues to deny cp and sob

## 2023-05-26 NOTE — Discharge Instructions (Addendum)
 Your workup this morning was reassuring.  As discussed I recommend following up with cardiology for further evaluation and management as needed.  If your chest pain returns or you develop other life-threatening symptoms please return to the emergency department.

## 2023-05-26 NOTE — ED Notes (Signed)
 Provider at bedside

## 2023-06-01 NOTE — Telephone Encounter (Signed)
 Couldn't tell if this was ever sent over to you guys!

## 2023-06-04 DIAGNOSIS — K219 Gastro-esophageal reflux disease without esophagitis: Secondary | ICD-10-CM | POA: Diagnosis not present

## 2023-06-04 DIAGNOSIS — R0789 Other chest pain: Secondary | ICD-10-CM | POA: Diagnosis not present

## 2023-06-04 DIAGNOSIS — E785 Hyperlipidemia, unspecified: Secondary | ICD-10-CM | POA: Diagnosis not present

## 2023-06-04 DIAGNOSIS — G720 Drug-induced myopathy: Secondary | ICD-10-CM | POA: Diagnosis not present

## 2023-06-04 DIAGNOSIS — K5900 Constipation, unspecified: Secondary | ICD-10-CM | POA: Diagnosis not present

## 2023-06-04 DIAGNOSIS — F419 Anxiety disorder, unspecified: Secondary | ICD-10-CM | POA: Diagnosis not present

## 2023-06-17 DIAGNOSIS — Z713 Dietary counseling and surveillance: Secondary | ICD-10-CM | POA: Diagnosis not present

## 2023-06-17 DIAGNOSIS — E785 Hyperlipidemia, unspecified: Secondary | ICD-10-CM | POA: Diagnosis not present

## 2023-06-22 ENCOUNTER — Encounter: Payer: Self-pay | Admitting: Cardiology

## 2023-06-22 ENCOUNTER — Ambulatory Visit: Attending: Cardiology | Admitting: Cardiology

## 2023-06-22 VITALS — BP 145/82 | HR 61 | Resp 16 | Ht 70.0 in | Wt 193.0 lb

## 2023-06-22 DIAGNOSIS — I25118 Atherosclerotic heart disease of native coronary artery with other forms of angina pectoris: Secondary | ICD-10-CM

## 2023-06-22 DIAGNOSIS — R9439 Abnormal result of other cardiovascular function study: Secondary | ICD-10-CM

## 2023-06-22 DIAGNOSIS — E782 Mixed hyperlipidemia: Secondary | ICD-10-CM | POA: Diagnosis not present

## 2023-06-22 DIAGNOSIS — I77810 Thoracic aortic ectasia: Secondary | ICD-10-CM | POA: Diagnosis not present

## 2023-06-22 LAB — LIPID PANEL

## 2023-06-22 MED ORDER — ROSUVASTATIN CALCIUM 20 MG PO TABS: 20.0000 mg | ORAL_TABLET | Freq: Every day | ORAL | 3 refills | Status: DC

## 2023-06-22 NOTE — Patient Instructions (Signed)
 Medication Instructions:  Your physician has recommended you make the following change in your medication:  Stop Atorvastatin Start Rosuvastatin 20 mg by mouth daily   *If you need a refill on your cardiac medications before your next appointment, please call your pharmacy*  Lab Work: Have lab work done today at [employer] [facility] [employer], BMP, Lipids If you have labs (blood work) drawn today and your tests are completely normal, you will receive your results only by: MyChart Message (if you have MyChart) OR A paper copy in the mail If you have any lab test that is abnormal or we need to change your treatment, we will call you to review the results.  Testing/Procedures: Your physician has requested that you have a cardiac catheterization. Cardiac catheterization is used to diagnose and/or treat various heart conditions. Doctors may recommend this procedure for a number of different reasons. The most common reason is to evaluate chest pain. Chest pain can be a symptom of coronary artery disease (CAD), and cardiac catheterization can show whether plaque is narrowing or blocking your heart's arteries. This procedure is also used to evaluate the valves, as well as measure the blood flow and oxygen levels in different parts of your heart. For further information please visit [email or web address] Please follow instruction sheet, as given. Scheduled for April 14  Follow-Up: At [facility], you and your health needs are our priority.  As part of our continuing mission to provide you with exceptional heart care, our providers are all part of one team.  This team includes your primary Cardiologist (physician) and Advanced Practice Providers or APPs (Physician Assistants and Nurse Practitioners) who all work together to provide you with the care you need, when you need it.  Your next appointment:   May 1  Provider:   Yates Decamp, MD     We recommend signing up for the patient portal called "MyChart".  Sign  up information is provided on this After Visit Summary.  MyChart is used to connect with patients for Virtual Visits (Telemedicine).  Patients are able to view lab/test results, encounter notes, upcoming appointments, etc.  Non-urgent messages can be sent to your provider as well.   To learn more about what you can do with MyChart, go to [email or web address].   Other Instructions  [facility] A DEPT OF [place] AT [facility] [address] [place], [place] [address] [place] [address] Dept: [phone] Loc: [phone]  Jesus [name]  06/22/2023  You are scheduled for a Cardiac Catheterization on Monday, April 14 with Dr. Jacinto Halim  1. Please arrive at the [facility] (Main Entrance A) at [facility]: [address] [place], [place] [address] at 8:30 AM (This time is 2 hour(s) before your procedure to ensure your preparation).   Free valet parking service is available. You will check in at ADMITTING. The support person will be asked to wait in the waiting room.  It is OK to have someone drop you off and come back when you are ready to be discharged.    Special note: Every effort is made to have your procedure done on time. Please understand that emergencies sometimes delay scheduled procedures.  2. Diet: Do not eat solid foods after midnight.  The patient may have clear liquids until 5am upon the day of the procedure.  3. Labs: You will need to have blood drawn today at [facility],   4. Medication instructions in preparation for your procedure:  Contrast Allergy: No  On the morning of your procedure, take your Aspirin 81 mg and any morning medicines NOT listed above.  You may use sips of water.  5. Plan to go home the same day, you will only stay overnight if medically necessary. 6. Bring a current list of your medications and current insurance cards. 7. You MUST have a responsible person to drive you home. 8. Someone MUST  be with you the first 24 hours after you arrive home or your discharge will be delayed. 9. Please wear clothes that are easy to get on and off and wear slip-on shoes.  Thank you for allowing [place] to care for you!   -- [place] [facility]       1st Floor: - Lobby - Registration  - Pharmacy  - Lab - Cafe  2nd Floor: - PV Lab - Diagnostic Testing (echo, CT, nuclear med)  3rd Floor: - Vacant  4th Floor: - TCTS (cardiothoracic surgery) - AFib Clinic - Structural Heart Clinic - Vascular Surgery  - Vascular Ultrasound  5th Floor: - HeartCare Cardiology (general and EP) - Clinical Pharmacy for coumadin, hypertension, lipid, weight-loss medications, and med management appointments    Valet parking services will be available as well.
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
## 2023-06-23 ENCOUNTER — Encounter: Payer: Self-pay | Admitting: Cardiology

## 2023-06-23 LAB — BASIC METABOLIC PANEL WITH GFR
BUN/Creatinine Ratio: 12 (ref 9–20)
BUN: 12 mg/dL (ref 6–24)
CO2: 24 mmol/L (ref 20–29)
Calcium: 10 mg/dL (ref 8.7–10.2)
Chloride: 103 mmol/L (ref 96–106)
Creatinine, Ser: 0.98 mg/dL (ref 0.76–1.27)
Glucose: 91 mg/dL (ref 70–99)
Potassium: 4.4 mmol/L (ref 3.5–5.2)
Sodium: 142 mmol/L (ref 134–144)
eGFR: 89 mL/min/{1.73_m2} (ref >59)

## 2023-06-23 LAB — LIPID PANEL
Cholesterol, Total: 117 mg/dL (ref 100–199)
HDL: 46 mg/dL (ref >39)
LDL CALC COMMENT:: 2.5 ratio (ref 0.0–5.0)
LDL Chol Calc (NIH): 59 mg/dL (ref 0–99)
Triglycerides: 49 mg/dL (ref 0–149)
VLDL Cholesterol Cal: 12 mg/dL (ref 5–40)

## 2023-06-23 LAB — CBC
Hematocrit: 40.8 % (ref 37.5–51.0)
Hemoglobin: 13.9 g/dL (ref 13.0–17.7)
MCH: 32.9 pg (ref 26.6–33.0)
MCHC: 34.1 g/dL (ref 31.5–35.7)
MCV: 97 fL (ref 79–97)
Platelets: 254 10*3/uL (ref 150–450)
RBC: 4.23 x10E6/uL (ref 4.14–5.80)
RDW: 12.7 % (ref 11.6–15.4)
WBC: 6 10*3/uL (ref 3.4–10.8)

## 2023-06-23 NOTE — Progress Notes (Signed)
 Your cholesterol is very well-controlled.  Will recheck since we are making switch from atorvastatin to rosuvastatin probably in about 2 to 3 months.  Your labs are stable to proceed with cardiac catheterization.

## 2023-06-24 ENCOUNTER — Other Ambulatory Visit: Payer: Self-pay | Admitting: Cardiology

## 2023-06-24 ENCOUNTER — Telehealth: Payer: Self-pay | Admitting: *Deleted

## 2023-06-24 DIAGNOSIS — E782 Mixed hyperlipidemia: Secondary | ICD-10-CM

## 2023-06-24 DIAGNOSIS — R931 Abnormal findings on diagnostic imaging of heart and coronary circulation: Secondary | ICD-10-CM

## 2023-06-24 NOTE — Telephone Encounter (Signed)
 Cardiac Catheterization scheduled at [facility] for: Monday June 28, 2023 10:30 AM Arrival time [facility] Main Entrance A at: 8:30 AM  Nothing to eat after midnight prior to procedure, clear liquids until 5 AM day of procedure.  Medication instructions: -Usual morning medications can be taken with sips of water including aspirin 81 mg.  Plan to go home the same day, you will only stay overnight if medically necessary.  You must have responsible adult to drive you home.  Someone must be with you the first 24 hours after you arrive home.  Reviewed procedure instructions with patient.

## 2023-06-28 ENCOUNTER — Encounter (HOSPITAL_COMMUNITY): Payer: Self-pay | Admitting: Cardiology

## 2023-06-28 ENCOUNTER — Other Ambulatory Visit (HOSPITAL_COMMUNITY): Payer: Self-pay

## 2023-06-28 ENCOUNTER — Ambulatory Visit (HOSPITAL_COMMUNITY)
Admission: RE | Admit: 2023-06-28 | Discharge: 2023-06-28 | Disposition: A | Discharge status: Home / Self Care | Attending: Cardiology | Admitting: Cardiology

## 2023-06-28 ENCOUNTER — Other Ambulatory Visit: Payer: Self-pay

## 2023-06-28 ENCOUNTER — Encounter (HOSPITAL_COMMUNITY)
Admission: RE | Disposition: A | Discharge status: Home / Self Care | Payer: Self-pay | Source: Home / Self Care | Attending: Cardiology

## 2023-06-28 DIAGNOSIS — I25118 Atherosclerotic heart disease of native coronary artery with other forms of angina pectoris: Secondary | ICD-10-CM | POA: Diagnosis not present

## 2023-06-28 DIAGNOSIS — I2584 Coronary atherosclerosis due to calcified coronary lesion: Secondary | ICD-10-CM | POA: Diagnosis not present

## 2023-06-28 DIAGNOSIS — Z7902 Long term (current) use of antithrombotics/antiplatelets: Secondary | ICD-10-CM | POA: Insufficient documentation

## 2023-06-28 DIAGNOSIS — E782 Mixed hyperlipidemia: Secondary | ICD-10-CM | POA: Insufficient documentation

## 2023-06-28 DIAGNOSIS — Z7982 Long term (current) use of aspirin: Secondary | ICD-10-CM | POA: Diagnosis not present

## 2023-06-28 DIAGNOSIS — Z955 Presence of coronary angioplasty implant and graft: Secondary | ICD-10-CM

## 2023-06-28 DIAGNOSIS — I2510 Atherosclerotic heart disease of native coronary artery without angina pectoris: Secondary | ICD-10-CM | POA: Diagnosis not present

## 2023-06-28 DIAGNOSIS — I77810 Thoracic aortic ectasia: Secondary | ICD-10-CM | POA: Diagnosis not present

## 2023-06-28 DIAGNOSIS — Z79899 Other long term (current) drug therapy: Secondary | ICD-10-CM | POA: Insufficient documentation

## 2023-06-28 HISTORY — PX: CORONARY STENT INTERVENTION: CATH118234

## 2023-06-28 HISTORY — PX: LEFT HEART CATH AND CORONARY ANGIOGRAPHY: CATH118249

## 2023-06-28 SURGERY — LEFT HEART CATH AND CORONARY ANGIOGRAPHY
Anesthesia: LOCAL

## 2023-06-28 MED ORDER — ONDANSETRON HCL 4 MG/2ML IJ SOLN: 4.0000 mg | Freq: Four times a day (QID) | INTRAMUSCULAR | Status: DC | PRN

## 2023-06-28 MED ORDER — ASPIRIN 81 MG PO CHEW: 81.0000 mg | CHEWABLE_TABLET | ORAL | Status: DC

## 2023-06-28 MED ORDER — HEPARIN SODIUM (PORCINE) 1000 UNIT/ML IJ SOLN
INTRAMUSCULAR | Status: DC | PRN
  Administered 2023-06-28: 5000 [IU] via INTRAVENOUS
  Administered 2023-06-28: 4000 [IU] via INTRAVENOUS
  Administered 2023-06-28: 3000 [IU] via INTRAVENOUS

## 2023-06-28 MED ORDER — LIDOCAINE HCL (PF) 1 % IJ SOLN
INTRAMUSCULAR | Status: AC
  Filled 2023-06-28: qty 30

## 2023-06-28 MED ORDER — SODIUM CHLORIDE 0.9 % IV SOLN: 250.0000 mL | INTRAVENOUS | Status: DC | PRN

## 2023-06-28 MED ORDER — HEPARIN (PORCINE) IN NACL 1000-0.9 UT/500ML-% IV SOLN
INTRAVENOUS | Status: DC | PRN
  Administered 2023-06-28 (×2): 500 mL via INTRA_ARTERIAL

## 2023-06-28 MED ORDER — CLOPIDOGREL BISULFATE 75 MG PO TABS
75.0000 mg | ORAL_TABLET | Freq: Every day | ORAL | 0 refills | Status: AC
  Filled 2023-06-28: qty 30, 30d supply, fill #0

## 2023-06-28 MED ORDER — ACETAMINOPHEN 325 MG PO TABS: 650.0000 mg | ORAL_TABLET | ORAL | Status: DC | PRN

## 2023-06-28 MED ORDER — SODIUM CHLORIDE 0.9 % WEIGHT BASED INFUSION: 3.0000 mL/kg/h | INTRAVENOUS | Status: AC

## 2023-06-28 MED ORDER — HEPARIN SODIUM (PORCINE) 1000 UNIT/ML IJ SOLN
INTRAMUSCULAR | Status: AC
  Filled 2023-06-28: qty 10

## 2023-06-28 MED ORDER — VERAPAMIL HCL 2.5 MG/ML IV SOLN
INTRAVENOUS | Status: AC
  Filled 2023-06-28: qty 2

## 2023-06-28 MED ORDER — SODIUM CHLORIDE 0.9% FLUSH: 3.0000 mL | INTRAVENOUS | Status: DC | PRN

## 2023-06-28 MED ORDER — VERAPAMIL HCL 2.5 MG/ML IV SOLN
INTRAVENOUS | Status: DC | PRN
  Administered 2023-06-28: 10 mL via INTRA_ARTERIAL

## 2023-06-28 MED ORDER — FENTANYL CITRATE (PF) 100 MCG/2ML IJ SOLN
INTRAMUSCULAR | Status: DC | PRN
  Administered 2023-06-28 (×2): 25 ug via INTRAVENOUS

## 2023-06-28 MED ORDER — NITROGLYCERIN 1 MG/10 ML FOR IR/CATH LAB
INTRA_ARTERIAL | Status: AC
  Filled 2023-06-28: qty 10

## 2023-06-28 MED ORDER — CLOPIDOGREL BISULFATE 300 MG PO TABS
ORAL_TABLET | ORAL | Status: DC | PRN
  Administered 2023-06-28: 600 mg via ORAL

## 2023-06-28 MED ORDER — IOHEXOL 350 MG/ML SOLN
INTRAVENOUS | Status: DC | PRN
  Administered 2023-06-28: 80 mL via INTRA_ARTERIAL

## 2023-06-28 MED ORDER — NITROGLYCERIN 0.4 MG SL SUBL: 0.4000 mg | SUBLINGUAL_TABLET | SUBLINGUAL | 2 refills | Status: AC | PRN

## 2023-06-28 MED ORDER — MIDAZOLAM HCL 2 MG/2ML IJ SOLN
INTRAMUSCULAR | Status: AC
Start: 2023-06-28 — End: ?
  Filled 2023-06-28: qty 2

## 2023-06-28 MED ORDER — LIDOCAINE HCL (PF) 1 % IJ SOLN
INTRAMUSCULAR | Status: DC | PRN
  Administered 2023-06-28: 2 mL

## 2023-06-28 MED ORDER — CLOPIDOGREL BISULFATE 300 MG PO TABS
ORAL_TABLET | ORAL | Status: AC
  Filled 2023-06-28: qty 1

## 2023-06-28 MED ORDER — FENTANYL CITRATE (PF) 100 MCG/2ML IJ SOLN
INTRAMUSCULAR | Status: AC
  Filled 2023-06-28: qty 2

## 2023-06-28 MED ORDER — SODIUM CHLORIDE 0.9 % WEIGHT BASED INFUSION: 1.0000 mL/kg/h | INTRAVENOUS | Status: DC

## 2023-06-28 MED ORDER — SODIUM CHLORIDE 0.9% FLUSH: 3.0000 mL | Freq: Two times a day (BID) | INTRAVENOUS | Status: DC

## 2023-06-28 MED ORDER — NITROGLYCERIN 1 MG/10 ML FOR IR/CATH LAB
INTRA_ARTERIAL | Status: DC | PRN
  Administered 2023-06-28: 200 ug via INTRACORONARY

## 2023-06-28 MED ORDER — CLOPIDOGREL BISULFATE 75 MG PO TABS: 75.0000 mg | ORAL_TABLET | Freq: Every day | ORAL | 2 refills | Status: DC

## 2023-06-28 MED ORDER — MIDAZOLAM HCL 2 MG/2ML IJ SOLN
INTRAMUSCULAR | Status: AC
  Filled 2023-06-28: qty 2

## 2023-06-28 MED ORDER — MIDAZOLAM HCL 2 MG/2ML IJ SOLN
INTRAMUSCULAR | Status: DC | PRN
  Administered 2023-06-28 (×2): 2 mg via INTRAVENOUS

## 2023-06-28 SURGICAL SUPPLY — 15 items
BALLN ~~LOC~~ EUPHORA RX 3.5X12 (BALLOONS) ×1 IMPLANT
BALLOON ~~LOC~~ EUPHORA RX 3.5X12 (BALLOONS) IMPLANT
CATH INFINITI AMBI 5FR TG (CATHETERS) IMPLANT
CATH LAUNCHER 6FR EBU3.5 (CATHETERS) IMPLANT
DEVICE RAD COMP TR BAND LRG (VASCULAR PRODUCTS) IMPLANT
GLIDESHEATH SLEND A-KIT 6F 22G (SHEATH) IMPLANT
GUIDEWIRE INQWIRE 1.5J.035X260 (WIRE) IMPLANT
INQWIRE 1.5J .035X260CM (WIRE) ×1 IMPLANT
KIT ENCORE 26 ADVANTAGE (KITS) IMPLANT
KIT HEMO VALVE WATCHDOG (MISCELLANEOUS) IMPLANT
PACK CARDIAC CATHETERIZATION (CUSTOM PROCEDURE TRAY) ×1 IMPLANT
SET ATX-X65L (MISCELLANEOUS) IMPLANT
STENT SYNERGY XD 3.50X20 (Permanent Stent) IMPLANT
STENT SYS SYNERGY XD 3.5X20 (Permanent Stent) ×1 IMPLANT
WIRE RUNTHROUGH .014X180CM (WIRE) IMPLANT

## 2023-06-28 NOTE — Significant Event (Signed)
    Called regarding patient having episode of bradycardia and vision changes.  Talking with patient he had recently had his TR band removed and became symptomatic with heart rate dropping to 38, felt nauseous, but no loss of consciousness.  Shortly thereafter he complained of kaleidoscope type vision in the right eye on the periphery.  No other neurodeficits noted.  He was initially evaluated and reported that his vision changes had improved but not completely resolved in the right eye.  On reevaluation 20 minutes later patient reports that his vision has completely resolved and no neurodeficits noted.  He was able to ambulate to the bathroom with no issues.  Suspect this was secondary to vasovagal event.  Discussed with interventional MD and given symptom resolution we will discharge home as initially planned.  Braulio Calender, NP-C 06/28/2023, 5:39 PM Pager: [phone]

## 2023-06-28 NOTE — Interval H&P Note (Signed)
 History and Physical Interval Note:  06/28/2023 10:42 AM  Jesus [name]  has presented today for surgery, with the diagnosis of Chest Pain.  The various methods of treatment have been discussed with the patient and family. After consideration of risks, benefits and other options for treatment, the patient has consented to  Procedure(s): LEFT HEART CATH AND CORONARY ANGIOGRAPHY ([name]) and possible coronary angioplasty as a surgical intervention.  The patient's history has been reviewed, patient examined, no change in status, stable for surgery.  I have reviewed the patient's chart and labs.  Questions were answered to the patient's satisfaction.   Cath Lab Visit (complete for each Cath Lab visit)  Clinical Evaluation Leading to the Procedure:   ACS: No.  Non-ACS:    Anginal Classification: CCS III  Anti-ischemic medical therapy: Minimal Therapy (1 class of medications)  Non-Invasive Test Results: High-risk stress test findings: cardiac mortality >3%/year  Prior CABG: No previous CABG   Knox Perl

## 2023-06-28 NOTE — Progress Notes (Signed)
 Discussed with pt stent, Plavix, restrictions, diet, exercise, NTG and CRPII. Pt receptive. Will refer to [facility] CRPII.  [id number] German Koller BS, ACSM-CEP 06/28/2023 1:39 PM

## 2023-06-28 NOTE — Discharge Summary (Addendum)
 Discharge Summary for Same Day PCI   Patient ID: Jesus [name] MRN: [id number]; DOB: [birth year]  Admit date: 06/28/2023 Discharge date: 06/28/2023  Primary Care Provider: Laurann Montana, MD  Primary Cardiologist: Yates Decamp, MD  Primary Electrophysiologist:  None   Discharge Diagnoses    Active Problems:   CAD (coronary artery disease)  Diagnostic Studies/Procedures    Cardiac Catheterization 06/28/2023:  Hemodynamic data: LVEDP 5 mmHg.  No pressure gradient across the aortic valve.   Angiographic data: LM: Is mildly calcified, large caliber vessel. LAD: It is a moderate caliber vessel, severely diffusely and heavily calcified all the way from the proximal to the mid segment.  Gives origin to large but small caliber D1 and D2 with D1 proximal 40% diffuse calcific stenosis and D2 with diffuse 40 to 50% proximal calcific stenosis and a focal 80% stenosis.  It is a 1.5 to 2 mm vessel. LCx: Gives origin to very small OM1 and OM 2 and continues as a large OM 3 with secondary branches.  Mid segment has a focal 80 to 90% mildly calcified stenosis. RCA: Dominant.  Gives origin of a large PDA and PL branches.  It is smooth and normal.   Intervention data: Successful PTCA and stenting of the mid CX with 3.5 x 20 mm Synergy XD DES postdilated with 3.5 x 12 mm [place] balloon at 20 atmospheric pressure, stenosis reduced from 80-90% to 0% with TIMI-3 to TIMI-3 flow.      Impression and recommendations: Patient strongly positive stress test is related to combination of a very large circumflex disease stenosis and diffusely diseased proximal LAD with diseased D1 and D2.  Would recommend continued aggressive medical therapy for diagonal and LAD disease as they are very small caliber.  Aspirin indefinitely and Plavix for 6 months.   _____________   History of Present Illness     Jesus [name] is a 58 y.o. male with PMH of HLD, aortic root dilatation who was referred to Dr. Jacinto Halim with  exertion dyspnea. Underwent outpatient myoview which was felt to be high risk with decreased LVEF may indicate multivessel disease or proximal LAD disease. Also was positive for EKG abnormalities at low level of workload at 5 minutes. Given this findings he was set up for outpatient cardiac cath.   Hospital Course     The patient underwent cardiac cath as noted above with mLCx lesion  of 90% treated with PCI/DES x1. Does have residual disease on the D2 of 80%, and 40% pLAD to be treated medically. Plan for DAPT with ASA/plavix for at least 6 months and then ASA indefinitely. The patient was seen by cardiac rehab while in short stay. There were no observed complications post cath. Radial cath site was re-evaluated prior to discharge and found to be stable without any complications. Instructions/precautions regarding cath site care were given prior to discharge.  [name] was seen by Dr. Jacinto Halim and determined stable for discharge home. Follow up with our office has been arranged. Medications are listed below. Pertinent changes include plavix and SL NTG.  _____________  Cath/PCI Registry Performance & Quality Measures: Aspirin prescribed? - Yes ADP Receptor Inhibitor (Plavix/Clopidogrel, Brilinta/Ticagrelor or Effient/Prasugrel) prescribed (includes medically managed patients)? - Yes High Intensity Statin (Lipitor 40-80mg  or Crestor 20-40mg ) prescribed? - Yes For EF <40%, was ACEI/ARB prescribed? - Not Applicable (EF >/= 40%) For EF <40%, Aldosterone Antagonist (Spironolactone or Eplerenone) prescribed? - Not Applicable (EF >/= 40%) Cardiac Rehab Phase II ordered (Included Medically managed  Patients)? - Yes  _____________   Discharge Vitals Blood pressure 126/79, pulse (!) 59, temperature 97.6 F (36.4 C), temperature source Oral, resp. rate 15, height 5\' 10"  (1.778 m), weight 81.6 kg, SpO2 99%.  Filed Weights   06/28/23 0830  Weight: 81.6 kg    Last Labs & Radiologic Studies     CBC No results for input(s): "WBC", "NEUTROABS", "HGB", "HCT", "MCV", "PLT" in the last 72 hours. Basic Metabolic Panel No results for input(s): "NA", "K", "CL", "CO2", "GLUCOSE", "BUN", "CREATININE", "CALCIUM", "MG", "PHOS" in the last 72 hours. Liver Function Tests No results for input(s): "AST", "ALT", "ALKPHOS", "BILITOT", "PROT", "ALBUMIN" in the last 72 hours. No results for input(s): "LIPASE", "AMYLASE" in the last 72 hours. High Sensitivity Troponin:   No results for input(s): "TROPONINIHS" in the last 720 hours.  BNP Invalid input(s): "POCBNP" D-Dimer No results for input(s): "DDIMER" in the last 72 hours. Hemoglobin A1C No results for input(s): "HGBA1C" in the last 72 hours. Fasting Lipid Panel No results for input(s): "CHOL", "HDL", "LDLCALC", "TRIG", "CHOLHDL", "LDLDIRECT" in the last 72 hours. Thyroid Function Tests No results for input(s): "TSH", "T4TOTAL", "T3FREE", "THYROIDAB" in the last 72 hours.  Invalid input(s): "FREET3" _____________  CARDIAC CATHETERIZATION Result Date: 06/28/2023 Images from the original result were not included. Cardiac Catheterization 06/28/23: Hemodynamic data: LVEDP 5 mmHg.  No pressure gradient across the aortic valve. Angiographic data: LM: Is mildly calcified, large caliber vessel. LAD: It is a moderate caliber vessel, severely diffusely and heavily calcified all the way from the proximal to the mid segment.  Gives origin to large but small caliber D1 and D2 with D1 proximal 40% diffuse calcific stenosis and D2 with diffuse 40 to 50% proximal calcific stenosis and a focal 80% stenosis.  It is a 1.5 to 2 mm vessel. LCx: Gives origin to very small OM1 and OM 2 and continues as a large OM 3 with secondary branches.  Mid segment has a focal 80 to 90% mildly calcified stenosis. RCA: Dominant.  Gives origin of a large PDA and PL branches.  It is smooth and normal. Intervention data: Successful PTCA and stenting of the mid CX with 3.5 x 20 mm Synergy  XD DES postdilated with 3.5 x 12 mm [place] balloon at 20 atmospheric pressure, stenosis reduced from 80-90% to 0% with TIMI-3 to TIMI-3 flow. Impression and recommendations: Patient strongly positive stress test is related to combination of a very large circumflex disease stenosis and diffusely diseased proximal LAD with diseased D1 and D2.  Would recommend continued aggressive medical therapy for diagonal and LAD disease as they are very small caliber.  Aspirin indefinitely and Plavix for 6 months.    Disposition   Pt is being discharged home today in good condition.  Follow-up Plans & Appointments     Discharge Instructions     Amb Referral to Cardiac Rehabilitation   Complete by: As directed    Diagnosis:  Coronary Stents PTCA     After initial evaluation and assessments completed: Virtual Based Care may be provided alone or in conjunction with Phase 2 Cardiac Rehab based on patient barriers.: Yes   Intensive Cardiac Rehabilitation (ICR) [facility] location only OR Traditional Cardiac Rehabilitation (TCR) *If criteria for ICR are not met will enroll in TCR [facility] only): Yes        Discharge Medications   Allergies as of 06/28/2023       Reactions   Fluoxetine Other (See Comments)   Jumpy   Paroxetine Other (  See Comments)   Jumpy   Sertraline Rash        Medication List     TAKE these medications    ALPRAZolam 0.25 MG tablet Commonly known as: XANAX Take 0.25 mg by mouth at bedtime as needed for anxiety.   aspirin 81 MG chewable tablet Commonly known as: Aspirin Childrens Chew 1 tablet (81 mg total) by mouth daily.   Claritin 10 MG tablet Generic drug: loratadine Take 10 mg by mouth daily.   clopidogrel 75 MG tablet Commonly known as: Plavix Take 1 tablet (75 mg total) by mouth daily.   CoQ10 100 MG Caps Take 100 mg by mouth daily.   ezetimibe 10 MG tablet Commonly known as: ZETIA Take 1 tablet (10 mg total) by mouth daily.   fluticasone 50 MCG/ACT nasal  spray Commonly known as: FLONASE Place 2 sprays into both nostrils as needed for allergies or rhinitis.   Magnesium Oxide -Mg Supplement 500 MG Caps Take 1 capsule by mouth daily.   multivitamin tablet Take 1 tablet by mouth daily.   nitroGLYCERIN 0.4 MG SL tablet Commonly known as: Nitrostat Place 1 tablet (0.4 mg total) under the tongue every 5 (five) minutes as needed.   polyethylene glycol 17 g packet Commonly known as: MIRALAX / GLYCOLAX Take 17 g by mouth daily.   propranolol 20 MG tablet Commonly known as: INDERAL Take 20 mg by mouth daily.   rosuvastatin 20 MG tablet Commonly known as: CRESTOR Take 1 tablet (20 mg total) by mouth daily.   Vitamin D-3 25 MCG (1000 UT) Caps Take 1 capsule by mouth daily.        Allergies Allergies  Allergen Reactions   Fluoxetine Other (See Comments)    Jumpy   Paroxetine Other (See Comments)    Jumpy   Sertraline Rash    Outstanding Labs/Studies   [name]   Duration of Discharge Encounter   Greater than 30 minutes including physician time.  Signed, Johnie Nailer, NP 06/28/2023, 1:49 PM

## 2023-06-28 NOTE — Discharge Instructions (Signed)

## 2023-06-28 NOTE — Progress Notes (Signed)
 Notified PA Johnie Nailer at 16:19 that patient vagaled when I changed the TR band bandage. Patient was symptomatic and HR dropped to 38 on the monitor. Patient never lost consciousness. Patient felt better after a minute flat and is now back to baseline. Patient called this RN at [phone] complaining of blurred vision in the peripherals. Notified PA lindsay Adelene Homer of vision change but no other neuro deficits. She is assessing patient now at 1657. MD Ganji aware.

## 2023-06-29 ENCOUNTER — Telehealth: Payer: Self-pay | Admitting: Cardiology

## 2023-06-29 LAB — POCT ACTIVATED CLOTTING TIME: Activated Clotting Time: 233 s

## 2023-06-29 NOTE — Telephone Encounter (Signed)
 Pt had a recent cath and is having indigestion and wants to make sure that is normal

## 2023-06-29 NOTE — Progress Notes (Signed)
 Client c/o "indigestion" and advised to call Dr Maida Sciara office and notify them of complaint

## 2023-06-29 NOTE — Telephone Encounter (Signed)
 Spoke to patient he stated he has been having indigestion ever since had cardiac cath yesterday.He took pepto bismol with no relief.Advised to try over the counter Pepcid.Advised to call back if Pepcid does not help.I will make Dr.Ganji aware.

## 2023-06-29 NOTE — Telephone Encounter (Signed)
 Please call in for Protonix 20 mg on empty stomach daily. 30 tab with 2 refills. Probably related to Plavix

## 2023-06-30 MED ORDER — PANTOPRAZOLE SODIUM 20 MG PO TBEC: DELAYED_RELEASE_TABLET | ORAL | 2 refills | Status: DC

## 2023-06-30 NOTE — Telephone Encounter (Signed)
 Spoke to patient he stated he did not take Pepcid.Stated indigestion not as bad today.Dr.Ganji advised take Protonix 20 mg daily on a empty stomach.

## 2023-07-01 ENCOUNTER — Emergency Department (HOSPITAL_COMMUNITY)
Admission: EM | Admit: 2023-07-01 | Discharge: 2023-07-01 | Disposition: A | Discharge status: Home / Self Care | Attending: Emergency Medicine | Admitting: Emergency Medicine

## 2023-07-01 ENCOUNTER — Telehealth: Payer: Self-pay | Admitting: Cardiology

## 2023-07-01 ENCOUNTER — Emergency Department (HOSPITAL_COMMUNITY)

## 2023-07-01 DIAGNOSIS — I6782 Cerebral ischemia: Secondary | ICD-10-CM | POA: Insufficient documentation

## 2023-07-01 DIAGNOSIS — R42 Dizziness and giddiness: Secondary | ICD-10-CM | POA: Diagnosis present

## 2023-07-01 DIAGNOSIS — R1310 Dysphagia, unspecified: Secondary | ICD-10-CM | POA: Diagnosis not present

## 2023-07-01 DIAGNOSIS — R519 Headache, unspecified: Secondary | ICD-10-CM | POA: Diagnosis not present

## 2023-07-01 DIAGNOSIS — I2510 Atherosclerotic heart disease of native coronary artery without angina pectoris: Secondary | ICD-10-CM | POA: Insufficient documentation

## 2023-07-01 DIAGNOSIS — R457 State of emotional shock and stress, unspecified: Secondary | ICD-10-CM | POA: Diagnosis not present

## 2023-07-01 DIAGNOSIS — E785 Hyperlipidemia, unspecified: Secondary | ICD-10-CM | POA: Diagnosis not present

## 2023-07-01 DIAGNOSIS — Z7982 Long term (current) use of aspirin: Secondary | ICD-10-CM | POA: Insufficient documentation

## 2023-07-01 DIAGNOSIS — Z955 Presence of coronary angioplasty implant and graft: Secondary | ICD-10-CM | POA: Diagnosis not present

## 2023-07-01 DIAGNOSIS — R55 Syncope and collapse: Secondary | ICD-10-CM | POA: Diagnosis not present

## 2023-07-01 LAB — DIFFERENTIAL
Abs Immature Granulocytes: 0.02 10*3/uL (ref 0.00–0.07)
Basophils Absolute: 0 10*3/uL (ref 0.0–0.1)
Basophils Relative: 1 %
Eosinophils Absolute: 0.1 10*3/uL (ref 0.0–0.5)
Eosinophils Relative: 1 %
Immature Granulocytes: 0 %
Lymphocytes Relative: 15 %
Lymphs Abs: 1 10*3/uL (ref 0.7–4.0)
Monocytes Absolute: 0.8 10*3/uL (ref 0.1–1.0)
Monocytes Relative: 12 %
Neutro Abs: 4.5 10*3/uL (ref 1.7–7.7)
Neutrophils Relative %: 71 %

## 2023-07-01 LAB — APTT: aPTT: 30 s (ref 24–36)

## 2023-07-01 LAB — COMPREHENSIVE METABOLIC PANEL WITH GFR
ALT: 32 U/L (ref 0–44)
AST: 36 U/L (ref 15–41)
Albumin: 4.1 g/dL (ref 3.5–5.0)
Alkaline Phosphatase: 48 U/L (ref 38–126)
Anion gap: 11 (ref 5–15)
BUN: 10 mg/dL (ref 6–20)
CO2: 23 mmol/L (ref 22–32)
Calcium: 9.1 mg/dL (ref 8.9–10.3)
Chloride: 102 mmol/L (ref 98–111)
Creatinine, Ser: 0.97 mg/dL (ref 0.61–1.24)
GFR, Estimated: >60 mL/min (ref >60)
Glucose, Bld: 105 mg/dL — ABNORMAL HIGH (ref 70–99)
Potassium: 4.1 mmol/L (ref 3.5–5.1)
Sodium: 136 mmol/L (ref 135–145)
Total Bilirubin: 0.6 mg/dL (ref 0.0–1.2)
Total Protein: 6.7 g/dL (ref 6.5–8.1)

## 2023-07-01 LAB — I-STAT CHEM 8, ED
BUN: 11 mg/dL (ref 6–20)
Calcium, Ion: 1.15 mmol/L (ref 1.15–1.40)
Chloride: 101 mmol/L (ref 98–111)
Creatinine, Ser: 1 mg/dL (ref 0.61–1.24)
Glucose, Bld: 102 mg/dL — ABNORMAL HIGH (ref 70–99)
HCT: 41 % (ref 39.0–52.0)
Hemoglobin: 13.9 g/dL (ref 13.0–17.0)
Potassium: 4 mmol/L (ref 3.5–5.1)
Sodium: 137 mmol/L (ref 135–145)
TCO2: 26 mmol/L (ref 22–32)

## 2023-07-01 LAB — CBC
HCT: 40.6 % (ref 39.0–52.0)
Hemoglobin: 14 g/dL (ref 13.0–17.0)
MCH: 33.1 pg (ref 26.0–34.0)
MCHC: 34.5 g/dL (ref 30.0–36.0)
MCV: 96 fL (ref 80.0–100.0)
Platelets: 232 10*3/uL (ref 150–400)
RBC: 4.23 MIL/uL (ref 4.22–5.81)
RDW: 12 % (ref 11.5–15.5)
WBC: 6.4 10*3/uL (ref 4.0–10.5)
nRBC: 0 % (ref 0.0–0.2)

## 2023-07-01 LAB — PROTIME-INR
INR: 1 (ref 0.8–1.2)
Prothrombin Time: 13.7 s (ref 11.4–15.2)

## 2023-07-01 LAB — ETHANOL: Alcohol, Ethyl (B): <10 mg/dL (ref <10)

## 2023-07-01 LAB — CBG MONITORING, ED: Glucose-Capillary: 104 mg/dL — ABNORMAL HIGH (ref 70–99)

## 2023-07-01 MED ORDER — MECLIZINE HCL 25 MG PO TABS
25.0000 mg | ORAL_TABLET | Freq: Once | ORAL | Status: AC
  Administered 2023-07-01: 25 mg via ORAL
  Filled 2023-07-01: qty 1

## 2023-07-01 MED ORDER — MECLIZINE HCL 25 MG PO TABS: 25.0000 mg | ORAL_TABLET | Freq: Three times a day (TID) | ORAL | 0 refills | Status: DC | PRN

## 2023-07-01 NOTE — ED Triage Notes (Signed)
 Patient arrives via EMS for eval of dizziness. States was present on waking this morning. States worse sitting up than lying down. No orthostatic changes with EMS.

## 2023-07-01 NOTE — Telephone Encounter (Signed)
 Spoke with wife per DPR with patient in the background. She states he has been dizzy for over 20 mins and feel faint. His BP is 120/80 HR 65. Patient denies any chest pain, SOB, headache, nausea or vomiting. He can not stand up with feeling like he is going to faint. He feels sluggish.   Advised patient if he can not stand up and can only lie down so he can does not faint. He needs to go to the ED for evaluation.

## 2023-07-01 NOTE — ED Provider Notes (Signed)
 [place] EMERGENCY DEPARTMENT AT [facility] Provider Note   CSN: [id number] Arrival date & time: 07/01/23  1324     History  Chief Complaint  Patient presents with   Dizziness    Jesus [name] is a 58 y.o. male.  58 year old male with a history of CAD status post PCI, hyperlipidemia, and vertigo who presents emergency department with dizziness.  Patient reports that he went to bed last night and woke up at 3 AM and was normal.  Says that he went back to bed and woke up this morning and felt as though the room was spinning.  Says that he sat up and was very dizzy.  Is having difficulty walking due to this and so he called 911.  Per EMS no orthostatic hypotension.  Denies any headache, neck pain, hearing loss, tinnitus, diplopia, difficulty swallowing, or difficulty speaking.  No history of stroke.       Home Medications Prior to Admission medications   Medication Sig Start Date End Date Taking? Authorizing Provider  meclizine (ANTIVERT) 25 MG tablet Take 1 tablet (25 mg total) by mouth 3 (three) times daily as needed for dizziness. 07/01/23  Yes Rondel Baton, MD  ALPRAZolam Prudy Feeler) 0.25 MG tablet Take 0.25 mg by mouth at bedtime as needed for anxiety.    [provider]  aspirin (ASPIRIN CHILDRENS) 81 MG chewable tablet Chew 1 tablet (81 mg total) by mouth daily. 05/05/23   Yates Decamp, MD  Cholecalciferol (VITAMIN D-3) 25 MCG (1000 UT) CAPS Take 1 capsule by mouth daily.    [provider]  clopidogrel (PLAVIX) 75 MG tablet Take 1 tablet (75 mg total) by mouth daily. 06/28/23 07/28/23  Arty Baumgartner, NP  Coenzyme Q10 (COQ10) 100 MG CAPS Take 100 mg by mouth daily.    [provider]  ezetimibe (ZETIA) 10 MG tablet Take 1 tablet (10 mg total) by mouth daily. 05/05/23 08/03/23  Yates Decamp, MD  fluticasone (FLONASE) 50 MCG/ACT nasal spray Place 2 sprays into both nostrils as needed for allergies or rhinitis.    [provider]   loratadine (CLARITIN) 10 MG tablet Take 10 mg by mouth daily.    [provider]  Magnesium Oxide -Mg Supplement 500 MG CAPS Take 1 capsule by mouth daily.    [provider]  Multiple Vitamin (MULTIVITAMIN) tablet Take 1 tablet by mouth daily.    [provider]  nitroGLYCERIN (NITROSTAT) 0.4 MG SL tablet Place 1 tablet (0.4 mg total) under the tongue every 5 (five) minutes as needed. 06/28/23   Arty Baumgartner, NP  pantoprazole (PROTONIX) 20 MG tablet Take 20 mg daily on a empty stomach 06/30/23   Yates Decamp, MD  polyethylene glycol (MIRALAX / GLYCOLAX) 17 g packet Take 17 g by mouth daily.    [provider]  propranolol (INDERAL) 20 MG tablet Take 20 mg by mouth daily. 04/12/17   [provider]  rosuvastatin (CRESTOR) 20 MG tablet Take 1 tablet (20 mg total) by mouth daily. 06/22/23 09/20/23  Yates Decamp, MD      Allergies    Fluoxetine, Paroxetine, and Sertraline    Review of Systems   Review of Systems  Physical Exam Updated Vital Signs BP 126/70 (BP Location: Right Arm)   Pulse 80   Temp 98.8 F (37.1 C) (Oral)   Resp 18   Ht 5\' 10"  (1.778 m)   Wt 81.6 kg   SpO2 98%   BMI 25.83 kg/m  Physical Exam Vitals and nursing note reviewed.  Constitutional:      General: He is not in acute distress.    Appearance: He is well-developed.  HENT:     Head: Normocephalic and atraumatic.     Right Ear: External ear normal.     Left Ear: External ear normal.     Nose: Nose normal.  Eyes:     Extraocular Movements: Extraocular movements intact.     Conjunctiva/sclera: Conjunctivae normal.     Pupils: Pupils are equal, round, and reactive to light.     Comments: Right fast beating nystagmus  Cardiovascular:     Rate and Rhythm: Normal rate and regular rhythm.     Heart sounds: Normal heart sounds.  Pulmonary:     Effort: Pulmonary effort is normal. No respiratory distress.     Breath sounds: Normal breath sounds.  Musculoskeletal:      Cervical back: Normal range of motion and neck supple.     Right lower leg: No edema.     Left lower leg: No edema.  Skin:    General: Skin is warm and dry.  Neurological:     Mental Status: He is alert.     Comments: NIHSS Exam  Level of Consciousness: Alert  LOC Questions: Answers Month and Age Correctly  LOC Commands: Opens and Closes Eyes and Hands on command  Best Gaze: Horizontal ocular movements intact  Visual Fields: No visual field loss  Facial Palsy: None  L Upper Extremity Motor: No drift after 10 seconds  R Upper Extremity Motor: No drift after 10 seconds  L Lower extremity Motor: No drift after 5 seconds  R Lower extremity Motor: No drift after 5 seconds  Ataxia: Absent  Sensory: Intact sensation to light touch on face, arms, trunk, and legs bilaterally  Best Language: No aphasia  Dysarthria: No dysarthria  Neglect: No visual or sensory neglect  Right fast beating nystagmus.  Catch-up saccade on head impulse test.  No refixation on test of skew.  Psychiatric:        Mood and Affect: Mood normal.        Behavior: Behavior normal.     ED Results / Procedures / Treatments   Labs (all labs ordered are listed, but only abnormal results are displayed) Labs Reviewed  COMPREHENSIVE METABOLIC PANEL WITH GFR - Abnormal; Notable for the following components:      Result Value   Glucose, Bld 105 (*)    All other components within normal limits  I-STAT CHEM 8, ED - Abnormal; Notable for the following components:   Glucose, Bld 102 (*)    All other components within normal limits  CBG MONITORING, ED - Abnormal; Notable for the following components:   Glucose-Capillary 104 (*)    All other components within normal limits  PROTIME-INR  APTT  CBC  DIFFERENTIAL  ETHANOL    EKG None  Radiology MR BRAIN WO CONTRAST Result Date: 07/01/2023 CLINICAL DATA:  Provided history: Vertigo. EXAM: MRI HEAD WITHOUT CONTRAST TECHNIQUE: Multiplanar, multiecho pulse sequences of the  brain and surrounding structures were obtained without intravenous contrast. COMPARISON:  Brain MRI 08/29/2014. FINDINGS: A routine protocol non-contrast brain MRI was ordered and performed. Brain: No age-advanced or lobar predominant cerebral atrophy. Multifocal T2 FLAIR hyperintense signal abnormality within the cerebral white matter (most notably within the left corona radiata/external capsule), overall mild. These findings are new from the prior brain MRI of 08/29/2014. Mild T2 FLAIR hyperintense signal changes within the pons,  similar to the prior MRI. There is no acute infarct. No evidence of an intracranial mass. No chronic intracranial blood products. No extra-axial fluid collection. No midline shift. Vascular: Maintained flow voids within the proximal large arterial vessels. Skull and upper cervical spine: No focal worrisome marrow lesion. Sinuses/Orbits: No mass or acute finding within the imaged orbits. Prior bilateral ocular lens replacement. No significant paranasal sinus disease. IMPRESSION: 1.  No evidence of an acute intracranial abnormality. 2. Mild T2 FLAIR hyperintense signal changes within the cerebral white matter and pons, nonspecific but most often secondary to chronic small vessel ischemia. 3. Otherwise unremarkable non-contrast MRI appearance of the brain. Electronically Signed   By: Jackey Loge D.O.   On: 07/01/2023 11:56    Procedures Procedures    Medications Ordered in ED Medications  meclizine (ANTIVERT) tablet 25 mg (25 mg Oral Given 07/01/23 1032)    ED Course/ Medical Decision Making/ A&P                                 Medical Decision Making Amount and/or Complexity of Data Reviewed Labs: ordered. Radiology: ordered.   THALES KNIPPLE is a 58 y.o. male with comorbidities that complicate the patient evaluation including CAD status post PCI, hyperlipidemia, and vertigo who presents emergency department with dizziness.    Initial Ddx:  Peripheral vertigo,  stroke, dissection, ICH  MDM/Course:  Patient presents emergency department with acute onset of vertigo.  Is outside of the window for TNKase so no indication for code stroke activation.  Van negative.  Does have refixation on his head impulse test suggesting a peripheral etiology of his vertigo.  However, given his risk factors feel that he is at high risk for stroke.  MRI is obtained that did not show acute stroke.  He had blood work that was reassuring.  Upon re-evaluation was feeling much better after meclizine.  Able to walk independently.  Will have him follow-up with ENT as an outpatient.  Given information on how to perform Epley maneuver.  Of note, patient had to be evaluated in the hallway so was not able to perform a dix Hallpike during his exam.  This patient presents to the ED for concern of complaints listed in HPI, this involves an extensive number of treatment options, and is a complaint that carries with it a high risk of complications and morbidity. Disposition including potential need for admission considered.   Dispo: DC Home. Return precautions discussed including, but not limited to, those listed in the AVS. Allowed pt time to ask questions which were answered fully prior to dc.  Records reviewed Outpatient Clinic Notes The following labs were independently interpreted: Chemistry and show no acute abnormality I personally reviewed and interpreted cardiac monitoring: normal sinus rhythm  I have reviewed the patients home medications and made adjustments as needed  Portions of this note were generated with Dragon dictation software. Dictation errors may occur despite best attempts at proofreading.     Final Clinical Impression(s) / ED Diagnoses Final diagnoses:  Vertigo    Rx / DC Orders ED Discharge Orders          Ordered    meclizine (ANTIVERT) 25 MG tablet  3 times daily PRN        07/01/23 1212              Rondel Baton, MD 07/01/23 1504

## 2023-07-01 NOTE — ED Notes (Signed)
 Patient discharged by RN. Patient verbalizes understanding of instructions without additional questions.

## 2023-07-01 NOTE — Discharge Instructions (Addendum)
 You were seen for your vertigo in the emergency department.   At home, please try the Epley maneuver to see if it improves your symptoms.  You may take the meclizine as needed for your dizziness as well..    Check your MyChart online for the results of any tests that had not resulted by the time you left the emergency department.   Follow-up with your primary doctor in 2-3 days regarding your visit.  Follow-up with the ear nose and throat doctors as well.  Return immediately to the emergency department if you experience any of the following: Double vision, difficulty speaking or swallowing, or any other concerning symptoms.    Thank you for visiting our Emergency Department. It was a pleasure taking care of you today.

## 2023-07-01 NOTE — Telephone Encounter (Signed)
 STAT if patient feels like he/she is going to faint   1. Are you feeling dizzy, lightheaded, or faint right now? Yes    2. Have you passed out?  No (If yes move to .SYNCOPECHMG)   3. Do you have any other symptoms? No   4. Have you checked your HR and BP (record if available)? 120/80 - 65

## 2023-07-01 NOTE — Telephone Encounter (Signed)
 Please have them  check BP laying down and standing up and send us  mychart message

## 2023-07-01 NOTE — ED Provider Triage Note (Signed)
 Emergency Medicine Provider Triage Evaluation Note  Jesus [name] , a 58 y.o. male CAD sp recent PCI, hld, and vertigo was evaluated in triage.  Pt complains of dizziness when he woke up. Was up at 0300 and did not feel dizzy. When laying in bed it felt like the room was spinning when he got up he got more dizzy. Has continued since then. No hx of stroke. Slightly worse with head turning.   Review of Systems  Positive: See above Negative: Vision changes, dysphagia, difficulty speaking, headache  Physical Exam  BP 126/88 (BP Location: Right Arm)   Pulse 77   Temp 98.8 F (37.1 C) (Oral)   Resp 18   Ht 5\' 10"  (1.778 m)   Wt 81.6 kg   SpO2 100%   BMI 25.83 kg/m  Gen:   Awake, no distress   Resp:  Normal effort  MSK:   Moves extremities without difficulty  Other:   NIHSS Exam  Level of Consciousness: Alert  LOC Questions: Answers Month and Age Correctly  LOC Commands: Opens and Closes Eyes and Hands on command  Best Gaze: Horizontal ocular movements intact with R fast beating nystagus Visual Fields: No visual field loss  Facial Palsy: None  L Upper Extremity Motor: No drift after 10 seconds  R Upper Extremity Motor: No drift after 10 seconds  L Lower extremity Motor: No drift after 5 seconds  R Lower extremity Motor: No drift after 5 seconds  Ataxia: Absent  Sensory: Intact sensation to light touch on face, arms, trunk, and legs bilaterally  Best Language: No aphasia  Dysarthria: No dysarthria  Neglect: No visual or sensory neglect   Head impulse with corrective saccade. No refixation on cover.   Medical Decision Making  Medically screening exam initiated at 10:19 AM.  Appropriate orders placed.  Jesus [name] was informed that the remainder of the evaluation will be completed by another provider, this initial triage assessment does not replace that evaluation, and the importance of remaining in the ED until their evaluation is complete.   Jesus Basket,  MD 07/01/23 1028

## 2023-07-06 DIAGNOSIS — Z9889 Other specified postprocedural states: Secondary | ICD-10-CM | POA: Diagnosis not present

## 2023-07-06 DIAGNOSIS — K5909 Other constipation: Secondary | ICD-10-CM | POA: Diagnosis not present

## 2023-07-06 DIAGNOSIS — I2510 Atherosclerotic heart disease of native coronary artery without angina pectoris: Secondary | ICD-10-CM | POA: Diagnosis not present

## 2023-07-06 DIAGNOSIS — H8110 Benign paroxysmal vertigo, unspecified ear: Secondary | ICD-10-CM | POA: Diagnosis not present

## 2023-07-15 ENCOUNTER — Encounter: Payer: Self-pay | Admitting: Cardiology

## 2023-07-15 ENCOUNTER — Ambulatory Visit: Payer: PPO | Attending: Cardiology | Admitting: Cardiology

## 2023-07-15 ENCOUNTER — Other Ambulatory Visit (HOSPITAL_COMMUNITY): Payer: Self-pay

## 2023-07-15 VITALS — BP 122/81 | HR 77 | Resp 16 | Ht 70.0 in | Wt 190.0 lb

## 2023-07-15 DIAGNOSIS — Z79899 Other long term (current) drug therapy: Secondary | ICD-10-CM | POA: Diagnosis not present

## 2023-07-15 DIAGNOSIS — E782 Mixed hyperlipidemia: Secondary | ICD-10-CM

## 2023-07-15 DIAGNOSIS — I77810 Thoracic aortic ectasia: Secondary | ICD-10-CM | POA: Diagnosis not present

## 2023-07-15 DIAGNOSIS — I25118 Atherosclerotic heart disease of native coronary artery with other forms of angina pectoris: Secondary | ICD-10-CM | POA: Diagnosis not present

## 2023-07-15 DIAGNOSIS — Z955 Presence of coronary angioplasty implant and graft: Secondary | ICD-10-CM | POA: Diagnosis not present

## 2023-07-15 MED ORDER — LOSARTAN POTASSIUM 25 MG PO TABS
25.0000 mg | ORAL_TABLET | Freq: Every evening | ORAL | 1 refills | Status: DC
  Filled 2023-07-15: qty 30, 30d supply, fill #0

## 2023-07-15 NOTE — Patient Instructions (Signed)
 Medication Instructions:  Your physician recommends that you continue on your current medications as directed. Please refer to the Current Medication list given to you today.  *If you need a refill on your cardiac medications before your next appointment, please call your pharmacy*  Lab Work: Lipid Panel and BMET in 1 month If you have labs (blood work) drawn today and your tests are completely normal, you will receive your results only by: MyChart Message (if you have MyChart) OR A paper copy in the mail If you have any lab test that is abnormal or we need to change your treatment, we will call you to review the results.  Testing/Procedures: None ordered.   Follow-Up: At [facility], you and your health needs are our priority.  As part of our continuing mission to provide you with exceptional heart care, our providers are all part of one team.  This team includes your primary Cardiologist (physician) and Advanced Practice Providers or APPs (Physician Assistants and Nurse Practitioners) who all work together to provide you with the care you need, when you need it.  Your next appointment:   6 months with Dr Ganji
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
## 2023-07-16 ENCOUNTER — Encounter: Payer: Self-pay | Admitting: Neurology

## 2023-07-19 DIAGNOSIS — I2510 Atherosclerotic heart disease of native coronary artery without angina pectoris: Secondary | ICD-10-CM | POA: Diagnosis not present

## 2023-07-19 DIAGNOSIS — M25562 Pain in left knee: Secondary | ICD-10-CM | POA: Diagnosis not present

## 2023-07-20 ENCOUNTER — Encounter: Payer: Self-pay | Admitting: Cardiology

## 2023-07-20 DIAGNOSIS — R42 Dizziness and giddiness: Secondary | ICD-10-CM | POA: Diagnosis not present

## 2023-07-20 DIAGNOSIS — I77810 Thoracic aortic ectasia: Secondary | ICD-10-CM

## 2023-07-20 DIAGNOSIS — I25118 Atherosclerotic heart disease of native coronary artery with other forms of angina pectoris: Secondary | ICD-10-CM

## 2023-07-21 ENCOUNTER — Telehealth (HOSPITAL_COMMUNITY): Payer: Self-pay

## 2023-07-21 NOTE — Telephone Encounter (Signed)
 Pt is interested in scheduling for cardiac rehab but pt has a f/u with ENT on 6/13 per nurse navigator she would like to call to see if pt is cleared to participate in CR.

## 2023-07-27 ENCOUNTER — Telehealth (HOSPITAL_COMMUNITY): Payer: Self-pay

## 2023-07-27 ENCOUNTER — Encounter (HOSPITAL_COMMUNITY): Payer: Self-pay

## 2023-07-27 MED ORDER — LOSARTAN POTASSIUM 25 MG PO TABS: 12.5000 mg | ORAL_TABLET | Freq: Every evening | ORAL | Status: DC

## 2023-07-27 NOTE — Telephone Encounter (Signed)
 Pt insurance is active and benefits verified through [employer] adv Co-pay $15, DED 0/0 met, out of pocket $3,400/$1,204.93 met, co-insurance 0%. no pre-authorization required. Danielle/HTA 07/27/2023@1030 , REF# [id number]   2ndary insurance is active and benefits verified through [employer]. Co-pay 0, DED $1,500/$1,500 met, out of pocket $5,900/$2,493.39 met, co-insurance 30%. No pre-authorization required. [employer], 07/27/2023@9 :56, REF# [id number]     How many CR sessions are covered? (ICR)72 Is this a lifetime maximum or an annual maximum? annual Has the member used any of these services to date? no Is there a time limit (weeks/months) on start of program and/or program completion? no

## 2023-07-27 NOTE — Telephone Encounter (Signed)
 Called patient to see if he was interested in participating in the Cardiac Rehab Program. Patient will come in for orientation on 08/02/23@115  and will attend the 145 exercise class.   Mailed package

## 2023-08-01 ENCOUNTER — Encounter: Payer: Self-pay | Admitting: Cardiology

## 2023-08-01 DIAGNOSIS — I25118 Atherosclerotic heart disease of native coronary artery with other forms of angina pectoris: Secondary | ICD-10-CM

## 2023-08-01 DIAGNOSIS — E782 Mixed hyperlipidemia: Secondary | ICD-10-CM

## 2023-08-01 DIAGNOSIS — Z79899 Other long term (current) drug therapy: Secondary | ICD-10-CM

## 2023-08-01 NOTE — Progress Notes (Signed)
 Ordered to evaluate for coagulopathy and see if the patient had been drinking.

## 2023-08-02 ENCOUNTER — Encounter (HOSPITAL_COMMUNITY)
Admission: RE | Admit: 2023-08-02 | Discharge: 2023-08-02 | Disposition: A | Discharge status: Home / Self Care | Source: Ambulatory Visit | Attending: Cardiology | Admitting: Cardiology

## 2023-08-02 VITALS — BP 116/78 | HR 66 | Ht 70.0 in | Wt 188.3 lb

## 2023-08-02 DIAGNOSIS — Z955 Presence of coronary angioplasty implant and graft: Secondary | ICD-10-CM | POA: Diagnosis not present

## 2023-08-02 NOTE — Progress Notes (Signed)
 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  Patient's Home Medications on Admission:  Current Outpatient Medications:    ALPRAZolam (XANAX) 0.25 MG tablet, Take 0.25 mg by mouth at bedtime as needed for anxiety., Disp: , Rfl:    aspirin  (ASPIRIN  CHILDRENS) 81 MG chewable tablet, Chew 1 tablet (81 mg total) by mouth daily., Disp: , Rfl:    Cholecalciferol (VITAMIN D -3) 25 MCG (1000 UT) CAPS, Take 1 capsule by mouth daily., Disp: , Rfl:    clopidogrel  (PLAVIX ) 75 MG tablet, Take 75 mg by mouth daily., Disp: , Rfl:    Coenzyme Q10 (COQ10) 100 MG CAPS, Take 100 mg by mouth daily., Disp: , Rfl:    ezetimibe  (ZETIA ) 10 MG tablet, Take 1 tablet (10 mg total) by mouth daily., Disp: 90 tablet, Rfl: 3   fluticasone (FLONASE) 50 MCG/ACT nasal spray, Place 2 sprays into both nostrils as needed for allergies or rhinitis., Disp: , Rfl:    loratadine (CLARITIN) 10 MG tablet, Take 10 mg by mouth daily., Disp: , Rfl:    losartan  (COZAAR ) 25 MG tablet, Take 0.5 tablets (12.5 mg total) by mouth every evening., Disp: , Rfl:    meclizine  (ANTIVERT ) 25 MG tablet, Take 1 tablet (25 mg total) by mouth 3 (three) times daily as needed for dizziness., Disp: 20 tablet, Rfl: 0   Multiple Vitamin (MULTIVITAMIN) tablet, Take 1 tablet by mouth daily., Disp: , Rfl:    nitroGLYCERIN  (NITROSTAT ) 0.4 MG SL tablet, Place 1 tablet (0.4 mg total) under the tongue every 5 (five) minutes as needed., Disp: 25 tablet, Rfl: 2   pantoprazole   (PROTONIX ) 20 MG tablet, Take 20 mg daily on a empty stomach, Disp: 30 tablet, Rfl: 2   polyethylene glycol (MIRALAX / GLYCOLAX) 17 g packet, Take 17 g by mouth daily., Disp: , Rfl:    propranolol (INDERAL) 20 MG tablet, Take 10 mg by mouth 2 (two) times daily., Disp: , Rfl:    rosuvastatin  (CRESTOR ) 20 MG tablet, Take 1 tablet (20 mg total) by mouth daily., Disp: 90 tablet, Rfl: 3   Magnesium Oxide -Mg Supplement 500 MG CAPS, Take 1 capsule by mouth daily. (Patient not taking: Reported on 08/02/2023), Disp: , Rfl:   Past Medical History: Past Medical History:  Diagnosis Date   Adenomatous colon polyp    Anxiety    Asthma    Depression    Drug-induced myopathy    Hearing loss    Hematuria    Hematuria    HLD (hyperlipidemia)    Hypercholesterolemia    Mild cognitive impairment    OSA (obstructive sleep apnea)     Tobacco Use: Social History   Tobacco Use  Smoking Status Never  Smokeless Tobacco Never    Labs: Review Flowsheet       Latest Ref Rng & Units 06/22/2023 07/01/2023  Labs for ITP Cardiac and Pulmonary Rehab  Cholestrol 100 - 199 mg/dL 962  -  LDL (calc) 0 - 99 mg/dL 59  -  HDL-C >95  mg/dL 46  -  Trlycerides 0 - 149 mg/dL 49  -  TCO2 22 - 32 mmol/L - 26     Capillary Blood Glucose: Lab Results  Component Value Date   GLUCAP 104 (H) 07/01/2023     Exercise Target Goals: Exercise Program Goal: Individual exercise prescription set using results from initial 6 min walk test and THRR while considering  patient's activity barriers and safety.   Exercise Prescription Goal: Initial exercise prescription builds to 30-45 minutes a day of aerobic activity, 2-3 days per week.  Home exercise guidelines will be given to patient during program as part of exercise prescription that the participant will acknowledge.  Activity Barriers & Risk Stratification:  Activity Barriers & Cardiac Risk Stratification - 08/02/23 1418       Activity Barriers & Cardiac Risk  Stratification   Activity Barriers Other (comment)    Comments myalgias/cramps    Cardiac Risk Stratification High   <5 METs on            6 Minute Walk:  6 Minute Walk     Row Name 08/02/23 1539         6 Minute Walk   Phase Initial     Distance 1800 feet     Walk Time 6 minutes     # of Rest Breaks 0     MPH 3.41     METS 3.96     RPE 14     Perceived Dyspnea  0     VO2 Peak 13.87     Symptoms No     Resting HR 66 bpm     Resting BP 116/78     Resting Oxygen Saturation  100 %     Exercise Oxygen Saturation  during 6 min walk 100 %     Max Ex. HR 110 bpm     Max Ex. BP 156/84     2 Minute Post BP 134/76              Oxygen Initial Assessment:   Oxygen Re-Evaluation:   Oxygen Discharge (Final Oxygen Re-Evaluation):   Initial Exercise Prescription:  Initial Exercise Prescription - 08/02/23 1500       Date of Initial Exercise RX and Referring Provider   Date 08/02/23    Referring Provider Knox Perl, MD    Expected Discharge Date 10/27/23      Treadmill   MPH 3    Grade 0    Minutes 15    METs 2.8      Bike   Level 2    Watts 60    Minutes 15    METs 2.8      Prescription Details   Frequency (times per week) 3    Duration Progress to 30 minutes of continuous aerobic without signs/symptoms of physical distress      Intensity   THRR 40-80% of Max Heartrate 64-129    Ratings of Perceived Exertion 11-13    Perceived Dyspnea 0-4      Progression   Progression Continue progressive overload as per policy without signs/symptoms or physical distress.      Resistance Training   Training Prescription Yes    Weight 4    Reps 10-15             Perform Capillary Blood Glucose checks as needed.  Exercise Prescription Changes:   Exercise Comments:   Exercise Goals and Review:   Exercise Goals     Row  Name 08/02/23 1419             Exercise Goals   Increase Physical Activity Yes       Intervention Provide advice,  education, support and counseling about physical activity/exercise needs.;Develop an individualized exercise prescription for aerobic and resistive training based on initial evaluation findings, risk stratification, comorbidities and participant's personal goals.       Expected Outcomes Short Term: Attend rehab on a regular basis to increase amount of physical activity.;Long Term: Exercising regularly at least 3-5 days a week.;Long Term: Add in home exercise to make exercise part of routine and to increase amount of physical activity.       Increase Strength and Stamina Yes       Intervention Develop an individualized exercise prescription for aerobic and resistive training based on initial evaluation findings, risk stratification, comorbidities and participant's personal goals.;Provide advice, education, support and counseling about physical activity/exercise needs.       Expected Outcomes Short Term: Increase workloads from initial exercise prescription for resistance, speed, and METs.;Short Term: Perform resistance training exercises routinely during rehab and add in resistance training at home;Long Term: Improve cardiorespiratory fitness, muscular endurance and strength as measured by increased METs and functional capacity ( )       Able to understand and use rate of perceived exertion (RPE) scale Yes       Intervention Provide education and explanation on how to use RPE scale       Expected Outcomes Long Term:  Able to use RPE to guide intensity level when exercising independently;Short Term: Able to use RPE daily in rehab to express subjective intensity level       Knowledge and understanding of Target Heart Rate Range (THRR) Yes       Intervention Provide education and explanation of THRR including how the numbers were predicted and where they are located for reference       Expected Outcomes Short Term: Able to state/look up THRR;Short Term: Able to use daily as guideline for intensity in  rehab;Long Term: Able to use THRR to govern intensity when exercising independently       Understanding of Exercise Prescription Yes       Intervention Provide education, explanation, and written materials on patient's individual exercise prescription       Expected Outcomes Short Term: Able to explain program exercise prescription;Long Term: Able to explain home exercise prescription to exercise independently                Exercise Goals Re-Evaluation :   Discharge Exercise Prescription (Final Exercise Prescription Changes):   Nutrition:  Target Goals: Understanding of nutrition guidelines, daily intake of sodium 1500mg , cholesterol 200mg , calories 30% from fat and 7% or less from saturated fats, daily to have 5 or more servings of fruits and vegetables.  Biometrics:    Nutrition Therapy Plan and Nutrition Goals:   Nutrition Assessments:  MEDIFICTS Score Key: >=70 Need to make dietary changes  40-70 Heart Healthy Diet <= 40 Therapeutic Level Cholesterol Diet    Picture Your Plate Scores: <16 Unhealthy dietary pattern with much room for improvement. 41-50 Dietary pattern unlikely to meet recommendations for good health and room for improvement. 51-60 More healthful dietary pattern, with some room for improvement.  >60 Healthy dietary pattern, although there may be some specific behaviors that could be improved.    Nutrition Goals Re-Evaluation:   Nutrition Goals Re-Evaluation:   Nutrition Goals Discharge (Final Nutrition Goals Re-Evaluation):   Psychosocial:  Target Goals: Acknowledge presence or absence of significant depression and/or stress, maximize coping skills, provide positive support system. Participant is able to verbalize types and ability to use techniques and skills needed for reducing stress and depression.  Initial Review & Psychosocial Screening:  Initial Psych Review & Screening - 08/02/23 1419       Initial Review   Current issues with  Current Anxiety/Panic;Current Stress Concerns    Source of Stress Concerns Chronic Illness;Unable to participate in former interests or hobbies    Comments Erla Haw shared that he has had some anxiety/stress since his stent placement. He has made a lot of changes to his diet and exercise since his stent placement, but figuring out what is the right thing for him to do has been a challenge for him an his wife and stressful at times. He is excited to be in the program and learn more. He also shared that communicating with other family and friends can be overwhelming when discussing health issues. He does not feel he needs additional resources at this time.      Family Dynamics   Good Support System? Yes   wife     Barriers   Psychosocial barriers to participate in program The patient should benefit from training in stress management and relaxation.      Screening Interventions   Interventions Provide feedback about the scores to participant;To provide support and resources with identified psychosocial needs;Encouraged to exercise    Expected Outcomes Long Term goal: The participant improves quality of Life and PHQ9 Scores as seen by post scores and/or verbalization of changes;Short Term goal: Identification and review with participant of any Quality of Life or Depression concerns found by scoring the questionnaire.;Long Term Goal: Stressors or current issues are controlled or eliminated.             Quality of Life Scores:  Quality of Life - 08/02/23 1540       Quality of Life   Select Quality of Life      Quality of Life Scores   Health/Function Pre 17.46 %    Socioeconomic Pre 22.43 %    Psych/Spiritual Pre 18.43 %    Family Pre 16.75 %    GLOBAL Pre 18.71 %            Scores of 19 and below usually indicate a poorer quality of life in these areas.  A difference of  2-3 points is a clinically meaningful difference.  A difference of 2-3 points in the total score of the Quality of  Life Index has been associated with significant improvement in overall quality of life, self-image, physical symptoms, and general health in studies assessing change in quality of life.  PHQ-9: Review Flowsheet       08/02/2023  Depression screen PHQ 2/9  Decreased Interest 1  Down, Depressed, Hopeless 0  PHQ - 2 Score 1  Altered sleeping 0  Tired, decreased energy 2  Change in appetite 0  Feeling bad or failure about yourself  0  Trouble concentrating 1  Moving slowly or fidgety/restless 0  Suicidal thoughts 0  PHQ-9 Score 4  Difficult doing work/chores Somewhat difficult   Interpretation of Total Score  Total Score Depression Severity:  1-4 = Minimal depression, 5-9 = Mild depression, 10-14 = Moderate depression, 15-19 = Moderately severe depression, 20-27 = Severe depression   Psychosocial Evaluation and Intervention:   Psychosocial Re-Evaluation:   Psychosocial Discharge (Final Psychosocial Re-Evaluation):   Vocational Rehabilitation: Provide vocational  rehab assistance to qualifying candidates.   Vocational Rehab Evaluation & Intervention:  Vocational Rehab - 08/02/23 1425       Initial Vocational Rehab Evaluation & Intervention   Assessment shows need for Vocational Rehabilitation No   Erla Haw is not working, on disability.            Education: Education Goals: Education classes will be provided on a weekly basis, covering required topics. Participant will state understanding/return demonstration of topics presented.     Core Videos: Exercise    Move It!  Clinical staff conducted group or individual video education with verbal and written material and guidebook.  Patient learns the recommended Pritikin exercise program. Exercise with the goal of living a long, healthy life. Some of the health benefits of exercise include controlled diabetes, healthier blood pressure levels, improved cholesterol levels, improved heart and lung capacity, improved sleep, and  better body composition. Everyone should speak with their doctor before starting or changing an exercise routine.  Biomechanical Limitations Clinical staff conducted group or individual video education with verbal and written material and guidebook.  Patient learns how biomechanical limitations can impact exercise and how we can mitigate and possibly overcome limitations to have an impactful and balanced exercise routine.  Body Composition Clinical staff conducted group or individual video education with verbal and written material and guidebook.  Patient learns that body composition (ratio of muscle mass to fat mass) is a key component to assessing overall fitness, rather than body weight alone. Increased fat mass, especially visceral belly fat, can put us  at increased risk for metabolic syndrome, type 2 diabetes, heart disease, and even death. It is recommended to combine diet and exercise (cardiovascular and resistance training) to improve your body composition. Seek guidance from your physician and exercise physiologist before implementing an exercise routine.  Exercise Action Plan Clinical staff conducted group or individual video education with verbal and written material and guidebook.  Patient learns the recommended strategies to achieve and enjoy long-term exercise adherence, including variety, self-motivation, self-efficacy, and positive decision making. Benefits of exercise include fitness, good health, weight management, more energy, better sleep, less stress, and overall well-being.  Medical   Heart Disease Risk Reduction Clinical staff conducted group or individual video education with verbal and written material and guidebook.  Patient learns our heart is our most vital organ as it circulates oxygen, nutrients, white blood cells, and hormones throughout the entire body, and carries waste away. Data supports a plant-based eating plan like the Pritikin Program for its effectiveness in  slowing progression of and reversing heart disease. The video provides a number of recommendations to address heart disease.   Metabolic Syndrome and Belly Fat  Clinical staff conducted group or individual video education with verbal and written material and guidebook.  Patient learns what metabolic syndrome is, how it leads to heart disease, and how one can reverse it and keep it from coming back. You have metabolic syndrome if you have 3 of the following 5 criteria: abdominal obesity, high blood pressure, high triglycerides, low HDL cholesterol, and high blood sugar.  Hypertension and Heart Disease Clinical staff conducted group or individual video education with verbal and written material and guidebook.  Patient learns that high blood pressure, or hypertension, is very common in the [place] . Hypertension is largely due to excessive salt intake, but other important risk factors include being overweight, physical inactivity, drinking too much alcohol, smoking, and not eating enough potassium from fruits and vegetables. High blood pressure is  a leading risk factor for heart attack, stroke, congestive heart failure, dementia, kidney failure, and premature death. Long-term effects of excessive salt intake include stiffening of the arteries and thickening of heart muscle and organ damage. Recommendations include ways to reduce hypertension and the risk of heart disease.  Diseases of Our Time - Focusing on Diabetes Clinical staff conducted group or individual video education with verbal and written material and guidebook.  Patient learns why the best way to stop diseases of our time is prevention, through food and other lifestyle changes. Medicine (such as prescription pills and surgeries) is often only a Band-Aid on the problem, not a long-term solution. Most common diseases of our time include obesity, type 2 diabetes, hypertension, heart disease, and cancer. The Pritikin Program is recommended and  has been proven to help reduce, reverse, and/or prevent the damaging effects of metabolic syndrome.  Nutrition   Overview of the Pritikin Eating Plan  Clinical staff conducted group or individual video education with verbal and written material and guidebook.  Patient learns about the Pritikin Eating Plan for disease risk reduction. The Pritikin Eating Plan emphasizes a wide variety of unrefined, minimally-processed carbohydrates, like fruits, vegetables, whole grains, and legumes. Go, Caution, and Stop food choices are explained. Plant-based and lean animal proteins are emphasized. Rationale provided for low sodium intake for blood pressure control, low added sugars for blood sugar stabilization, and low added fats and oils for coronary artery disease risk reduction and weight management.  Calorie Density  Clinical staff conducted group or individual video education with verbal and written material and guidebook.  Patient learns about calorie density and how it impacts the Pritikin Eating Plan. Knowing the characteristics of the food you choose will help you decide whether those foods will lead to weight gain or weight loss, and whether you want to consume more or less of them. Weight loss is usually a side effect of the Pritikin Eating Plan because of its focus on low calorie-dense foods.  Label Reading  Clinical staff conducted group or individual video education with verbal and written material and guidebook.  Patient learns about the Pritikin recommended label reading guidelines and corresponding recommendations regarding calorie density, added sugars, sodium content, and whole grains.  Dining Out - Part 1  Clinical staff conducted group or individual video education with verbal and written material and guidebook.  Patient learns that restaurant meals can be sabotaging because they can be so high in calories, fat, sodium, and/or sugar. Patient learns recommended strategies on how to positively  address this and avoid unhealthy pitfalls.  Facts on Fats  Clinical staff conducted group or individual video education with verbal and written material and guidebook.  Patient learns that lifestyle modifications can be just as effective, if not more so, as many medications for lowering your risk of heart disease. A Pritikin lifestyle can help to reduce your risk of inflammation and atherosclerosis (cholesterol build-up, or plaque, in the artery walls). Lifestyle interventions such as dietary choices and physical activity address the cause of atherosclerosis. A review of the types of fats and their impact on blood cholesterol levels, along with dietary recommendations to reduce fat intake is also included.  Nutrition Action Plan  Clinical staff conducted group or individual video education with verbal and written material and guidebook.  Patient learns how to incorporate Pritikin recommendations into their lifestyle. Recommendations include planning and keeping personal health goals in mind as an important part of their success.  Healthy Mind-Set  Healthy Minds, Bodies, Hearts  Clinical staff conducted group or individual video education with verbal and written material and guidebook.  Patient learns how to identify when they are stressed. Video will discuss the impact of that stress, as well as the many benefits of stress management. Patient will also be introduced to stress management techniques. The way we think, act, and feel has an impact on our hearts.  How Our Thoughts Can Heal Our Hearts  Clinical staff conducted group or individual video education with verbal and written material and guidebook.  Patient learns that negative thoughts can cause depression and anxiety. This can result in negative lifestyle behavior and serious health problems. Cognitive behavioral therapy is an effective method to help control our thoughts in order to change and improve our emotional outlook.  Additional  Videos:  Exercise    Improving Performance  Clinical staff conducted group or individual video education with verbal and written material and guidebook.  Patient learns to use a non-linear approach by alternating intensity levels and lengths of time spent exercising to help burn more calories and lose more body fat. Cardiovascular exercise helps improve heart health, metabolism, hormonal balance, blood sugar control, and recovery from fatigue. Resistance training improves strength, endurance, balance, coordination, reaction time, metabolism, and muscle mass. Flexibility exercise improves circulation, posture, and balance. Seek guidance from your physician and exercise physiologist before implementing an exercise routine and learn your capabilities and proper form for all exercise.  Introduction to Yoga  Clinical staff conducted group or individual video education with verbal and written material and guidebook.  Patient learns about yoga, a discipline of the coming together of mind, breath, and body. The benefits of yoga include improved flexibility, improved range of motion, better posture and core strength, increased lung function, weight loss, and positive self-image. Yoga's heart health benefits include lowered blood pressure, healthier heart rate, decreased cholesterol and triglyceride levels, improved immune function, and reduced stress. Seek guidance from your physician and exercise physiologist before implementing an exercise routine and learn your capabilities and proper form for all exercise.  Medical   Aging: Enhancing Your Quality of Life  Clinical staff conducted group or individual video education with verbal and written material and guidebook.  Patient learns key strategies and recommendations to stay in good physical health and enhance quality of life, such as prevention strategies, having an advocate, securing a Health Care Proxy and Power of Attorney, and keeping a list of medications  and system for tracking them. It also discusses how to avoid risk for bone loss.  Biology of Weight Control  Clinical staff conducted group or individual video education with verbal and written material and guidebook.  Patient learns that weight gain occurs because we consume more calories than we burn (eating more, moving less). Even if your body weight is normal, you may have higher ratios of fat compared to muscle mass. Too much body fat puts you at increased risk for cardiovascular disease, heart attack, stroke, type 2 diabetes, and obesity-related cancers. In addition to exercise, following the Pritikin Eating Plan can help reduce your risk.  Decoding Lab Results  Clinical staff conducted group or individual video education with verbal and written material and guidebook.  Patient learns that lab test reflects one measurement whose values change over time and are influenced by many factors, including medication, stress, sleep, exercise, food, hydration, pre-existing medical conditions, and more. It is recommended to use the knowledge from this video to become more involved with your lab results  and evaluate your numbers to speak with your doctor.   Diseases of Our Time - Overview  Clinical staff conducted group or individual video education with verbal and written material and guidebook.  Patient learns that according to the CDC, 50% to 70% of chronic diseases (such as obesity, type 2 diabetes, elevated lipids, hypertension, and heart disease) are avoidable through lifestyle improvements including healthier food choices, listening to satiety cues, and increased physical activity.  Sleep Disorders Clinical staff conducted group or individual video education with verbal and written material and guidebook.  Patient learns how good quality and duration of sleep are important to overall health and well-being. Patient also learns about sleep disorders and how they impact health along with  recommendations to address them, including discussing with a physician.  Nutrition  Dining Out - Part 2 Clinical staff conducted group or individual video education with verbal and written material and guidebook.  Patient learns how to plan ahead and communicate in order to maximize their dining experience in a healthy and nutritious manner. Included are recommended food choices based on the type of restaurant the patient is visiting.   Fueling a Banker conducted group or individual video education with verbal and written material and guidebook.  There is a strong connection between our food choices and our health. Diseases like obesity and type 2 diabetes are very prevalent and are in large-part due to lifestyle choices. The Pritikin Eating Plan provides plenty of food and hunger-curbing satisfaction. It is easy to follow, affordable, and helps reduce health risks.  Menu Workshop  Clinical staff conducted group or individual video education with verbal and written material and guidebook.  Patient learns that restaurant meals can sabotage health goals because they are often packed with calories, fat, sodium, and sugar. Recommendations include strategies to plan ahead and to communicate with the manager, chef, or server to help order a healthier meal.  Planning Your Eating Strategy  Clinical staff conducted group or individual video education with verbal and written material and guidebook.  Patient learns about the Pritikin Eating Plan and its benefit of reducing the risk of disease. The Pritikin Eating Plan does not focus on calories. Instead, it emphasizes high-quality, nutrient-rich foods. By knowing the characteristics of the foods, we choose, we can determine their calorie density and make informed decisions.  Targeting Your Nutrition Priorities  Clinical staff conducted group or individual video education with verbal and written material and guidebook.  Patient learns  that lifestyle habits have a tremendous impact on disease risk and progression. This video provides eating and physical activity recommendations based on your personal health goals, such as reducing LDL cholesterol, losing weight, preventing or controlling type 2 diabetes, and reducing high blood pressure.  Vitamins and Minerals  Clinical staff conducted group or individual video education with verbal and written material and guidebook.  Patient learns different ways to obtain key vitamins and minerals, including through a recommended healthy diet. It is important to discuss all supplements you take with your doctor.   Healthy Mind-Set    Smoking Cessation  Clinical staff conducted group or individual video education with verbal and written material and guidebook.  Patient learns that cigarette smoking and tobacco addiction pose a serious health risk which affects millions of people. Stopping smoking will significantly reduce the risk of heart disease, lung disease, and many forms of cancer. Recommended strategies for quitting are covered, including working with your doctor to develop a successful plan.  Culinary  Becoming a Set designer conducted group or individual video education with verbal and written material and guidebook.  Patient learns that cooking at home can be healthy, cost-effective, quick, and puts them in control. Keys to cooking healthy recipes will include looking at your recipe, assessing your equipment needs, planning ahead, making it simple, choosing cost-effective seasonal ingredients, and limiting the use of added fats, salts, and sugars.  Cooking - Breakfast and Snacks  Clinical staff conducted group or individual video education with verbal and written material and guidebook.  Patient learns how important breakfast is to satiety and nutrition through the entire day. Recommendations include key foods to eat during breakfast to help stabilize blood sugar  levels and to prevent overeating at meals later in the day. Planning ahead is also a key component.  Cooking - Educational psychologist conducted group or individual video education with verbal and written material and guidebook.  Patient learns eating strategies to improve overall health, including an approach to cook more at home. Recommendations include thinking of animal protein as a side on your plate rather than center stage and focusing instead on lower calorie dense options like vegetables, fruits, whole grains, and plant-based proteins, such as beans. Making sauces in large quantities to freeze for later and leaving the skin on your vegetables are also recommended to maximize your experience.  Cooking - Healthy Salads and Dressing Clinical staff conducted group or individual video education with verbal and written material and guidebook.  Patient learns that vegetables, fruits, whole grains, and legumes are the foundations of the Pritikin Eating Plan. Recommendations include how to incorporate each of these in flavorful and healthy salads, and how to create homemade salad dressings. Proper handling of ingredients is also covered. Cooking - Soups and [employer] - Soups and Desserts Clinical staff conducted group or individual video education with verbal and written material and guidebook.  Patient learns that Pritikin soups and desserts make for easy, nutritious, and delicious snacks and meal components that are low in sodium, fat, sugar, and calorie density, while high in vitamins, minerals, and filling fiber. Recommendations include simple and healthy ideas for soups and desserts.   Overview     The Pritikin Solution Program Overview Clinical staff conducted group or individual video education with verbal and written material and guidebook.  Patient learns that the results of the Pritikin Program have been documented in more than 100 articles published in peer-reviewed  journals, and the benefits include reducing risk factors for (and, in some cases, even reversing) high cholesterol, high blood pressure, type 2 diabetes, obesity, and more! An overview of the three key pillars of the Pritikin Program will be covered: eating well, doing regular exercise, and having a healthy mind-set.  WORKSHOPS  Exercise: Exercise Basics: Building Your Action Plan Clinical staff led group instruction and group discussion with PowerPoint presentation and patient guidebook. To enhance the learning environment the use of posters, models and videos may be added. At the conclusion of this workshop, patients will comprehend the difference between physical activity and exercise, as well as the benefits of incorporating both, into their routine. Patients will understand the FITT (Frequency, Intensity, Time, and Type) principle and how to use it to build an exercise action plan. In addition, safety concerns and other considerations for exercise and cardiac rehab will be addressed by the presenter. The purpose of this lesson is to promote a comprehensive and effective weekly exercise routine in order to improve  patients' overall level of fitness.   Managing Heart Disease: Your Path to a Healthier Heart Clinical staff led group instruction and group discussion with PowerPoint presentation and patient guidebook. To enhance the learning environment the use of posters, models and videos may be added.At the conclusion of this workshop, patients will understand the anatomy and physiology of the heart. Additionally, they will understand how Pritikin's three pillars impact the risk factors, the progression, and the management of heart disease.  The purpose of this lesson is to provide a high-level overview of the heart, heart disease, and how the Pritikin lifestyle positively impacts risk factors.  Exercise Biomechanics Clinical staff led group instruction and group discussion with PowerPoint  presentation and patient guidebook. To enhance the learning environment the use of posters, models and videos may be added. Patients will learn how the structural parts of their bodies function and how these functions impact their daily activities, movement, and exercise. Patients will learn how to promote a neutral spine, learn how to manage pain, and identify ways to improve their physical movement in order to promote healthy living. The purpose of this lesson is to expose patients to common physical limitations that impact physical activity. Participants will learn practical ways to adapt and manage aches and pains, and to minimize their effect on regular exercise. Patients will learn how to maintain good posture while sitting, walking, and lifting.  Balance Training and Fall Prevention  Clinical staff led group instruction and group discussion with PowerPoint presentation and patient guidebook. To enhance the learning environment the use of posters, models and videos may be added. At the conclusion of this workshop, patients will understand the importance of their sensorimotor skills (vision, proprioception, and the vestibular system) in maintaining their ability to balance as they age. Patients will apply a variety of balancing exercises that are appropriate for their current level of function. Patients will understand the common causes for poor balance, possible solutions to these problems, and ways to modify their physical environment in order to minimize their fall risk. The purpose of this lesson is to teach patients about the importance of maintaining balance as they age and ways to minimize their risk of falling.  WORKSHOPS   Nutrition:  Fueling a Ship broker led group instruction and group discussion with PowerPoint presentation and patient guidebook. To enhance the learning environment the use of posters, models and videos may be added. Patients will review the  foundational principles of the Pritikin Eating Plan and understand what constitutes a serving size in each of the food groups. Patients will also learn Pritikin-friendly foods that are better choices when away from home and review make-ahead meal and snack options. Calorie density will be reviewed and applied to three nutrition priorities: weight maintenance, weight loss, and weight gain. The purpose of this lesson is to reinforce (in a group setting) the key concepts around what patients are recommended to eat and how to apply these guidelines when away from home by planning and selecting Pritikin-friendly options. Patients will understand how calorie density may be adjusted for different weight management goals.  Mindful Eating  Clinical staff led group instruction and group discussion with PowerPoint presentation and patient guidebook. To enhance the learning environment the use of posters, models and videos may be added. Patients will briefly review the concepts of the Pritikin Eating Plan and the importance of low-calorie dense foods. The concept of mindful eating will be introduced as well as the importance of paying attention to internal  hunger signals. Triggers for non-hunger eating and techniques for dealing with triggers will be explored. The purpose of this lesson is to provide patients with the opportunity to review the basic principles of the Pritikin Eating Plan, discuss the value of eating mindfully and how to measure internal cues of hunger and fullness using the Hunger Scale. Patients will also discuss reasons for non-hunger eating and learn strategies to use for controlling emotional eating.  Targeting Your Nutrition Priorities Clinical staff led group instruction and group discussion with PowerPoint presentation and patient guidebook. To enhance the learning environment the use of posters, models and videos may be added. Patients will learn how to determine their genetic susceptibility to  disease by reviewing their family history. Patients will gain insight into the importance of diet as part of an overall healthy lifestyle in mitigating the impact of genetics and other environmental insults. The purpose of this lesson is to provide patients with the opportunity to assess their personal nutrition priorities by looking at their family history, their own health history and current risk factors. Patients will also be able to discuss ways of prioritizing and modifying the Pritikin Eating Plan for their highest risk areas  Menu  Clinical staff led group instruction and group discussion with PowerPoint presentation and patient guidebook. To enhance the learning environment the use of posters, models and videos may be added. Using menus brought in from [employer], or printed from [employer], patients will apply the Pritikin dining out guidelines that were presented in the [employer] video. Patients will also be able to practice these guidelines in a variety of provided scenarios. The purpose of this lesson is to provide patients with the opportunity to practice hands-on learning of the Pritikin Dining Out guidelines with actual menus and practice scenarios.  Label Reading Clinical staff led group instruction and group discussion with PowerPoint presentation and patient guidebook. To enhance the learning environment the use of posters, models and videos may be added. Patients will review and discuss the Pritikin label reading guidelines presented in Pritikin's Label Reading Educational series video. Using fool labels brought in from local grocery stores and markets, patients will apply the label reading guidelines and determine if the packaged food meet the Pritikin guidelines. The purpose of this lesson is to provide patients with the opportunity to review, discuss, and practice hands-on learning of the Pritikin Label Reading guidelines with actual packaged food  labels. Cooking School  Pritikin's [employer] are designed to teach patients ways to prepare quick, simple, and affordable recipes at home. The importance of nutrition's role in chronic disease risk reduction is reflected in its emphasis in the overall Pritikin program. By learning how to prepare essential core Pritikin Eating Plan recipes, patients will increase control over what they eat; be able to customize the flavor of foods without the use of added salt, sugar, or fat; and improve the quality of the food they consume. By learning a set of core recipes which are easily assembled, quickly prepared, and affordable, patients are more likely to prepare more healthy foods at home. These workshops focus on convenient breakfasts, simple entres, side dishes, and desserts which can be prepared with minimal effort and are consistent with nutrition recommendations for cardiovascular risk reduction. Cooking [employer] are taught by a Armed forces logistics/support/administrative officer (RD) who has been trained by the [employer]. The chef or RD has a clear understanding of the importance of minimizing - if not completely eliminating -  added fat, sugar, and sodium in recipes. Throughout the series of Cooking School Workshop sessions, patients will learn about healthy ingredients and efficient methods of cooking to build confidence in their capability to prepare    Cooking School weekly topics:  Adding Flavor- Sodium-Free  Fast and Healthy Breakfasts  Powerhouse Plant-Based Proteins  Satisfying Salads and Dressings  Simple Sides and Sauces  International Cuisine-Spotlight on the [employer] Zones  Delicious Desserts  Savory Soups  [employer] - Meals in a Astronomer Appetizers and Snacks  Comforting Weekend Breakfasts  One-Pot Wonders   Fast Evening Meals  Landscape architect Your Pritikin Plate  WORKSHOPS   Healthy Mindset (Psychosocial):  Focused Goals, Sustainable  Changes Clinical staff led group instruction and group discussion with PowerPoint presentation and patient guidebook. To enhance the learning environment the use of posters, models and videos may be added. Patients will be able to apply effective goal setting strategies to establish at least one personal goal, and then take consistent, meaningful action toward that goal. They will learn to identify common barriers to achieving personal goals and develop strategies to overcome them. Patients will also gain an understanding of how our mind-set can impact our ability to achieve goals and the importance of cultivating a positive and growth-oriented mind-set. The purpose of this lesson is to provide patients with a deeper understanding of how to set and achieve personal goals, as well as the tools and strategies needed to overcome common obstacles which may arise along the way.  From Head to Heart: The Power of a Healthy Outlook  Clinical staff led group instruction and group discussion with PowerPoint presentation and patient guidebook. To enhance the learning environment the use of posters, models and videos may be added. Patients will be able to recognize and describe the impact of emotions and mood on physical health. They will discover the importance of self-care and explore self-care practices which may work for them. Patients will also learn how to utilize the 4 C's to cultivate a healthier outlook and better manage stress and challenges. The purpose of this lesson is to demonstrate to patients how a healthy outlook is an essential part of maintaining good health, especially as they continue their cardiac rehab journey.  Healthy Sleep for a Healthy Heart Clinical staff led group instruction and group discussion with PowerPoint presentation and patient guidebook. To enhance the learning environment the use of posters, models and videos may be added. At the conclusion of this workshop, patients will be able  to demonstrate knowledge of the importance of sleep to overall health, well-being, and quality of life. They will understand the symptoms of, and treatments for, common sleep disorders. Patients will also be able to identify daytime and nighttime behaviors which impact sleep, and they will be able to apply these tools to help manage sleep-related challenges. The purpose of this lesson is to provide patients with a general overview of sleep and outline the importance of quality sleep. Patients will learn about a few of the most common sleep disorders. Patients will also be introduced to the concept of "sleep hygiene," and discover ways to self-manage certain sleeping problems through simple daily behavior changes. Finally, the workshop will motivate patients by clarifying the links between quality sleep and their goals of heart-healthy living.   Recognizing and Reducing Stress Clinical staff led group instruction and group discussion with PowerPoint presentation and patient guidebook. To enhance the learning environment the use of posters, models and videos may be  added. At the conclusion of this workshop, patients will be able to understand the types of stress reactions, differentiate between acute and chronic stress, and recognize the impact that chronic stress has on their health. They will also be able to apply different coping mechanisms, such as reframing negative self-talk. Patients will have the opportunity to practice a variety of stress management techniques, such as deep abdominal breathing, progressive muscle relaxation, and/or guided imagery.  The purpose of this lesson is to educate patients on the role of stress in their lives and to provide healthy techniques for coping with it.  Learning Barriers/Preferences:  Learning Barriers/Preferences - 08/02/23 1424       Learning Barriers/Preferences   Learning Barriers Sight    Learning Preferences Computer/Internet;Group  Instruction;Pictoral;Written Material;Video;Verbal Instruction;Individual Instruction;Skilled Demonstration             Education Topics:  Knowledge Questionnaire Score:  Knowledge Questionnaire Score - 08/02/23 1425       Knowledge Questionnaire Score   Pre Score 22/24             Core Components/Risk Factors/Patient Goals at Admission:  Personal Goals and Risk Factors at Admission - 08/02/23 1431       Core Components/Risk Factors/Patient Goals on Admission    Weight Management Yes    Intervention Weight Management: Develop a combined nutrition and exercise program designed to reach desired caloric intake, while maintaining appropriate intake of nutrient and fiber, sodium and fats, and appropriate energy expenditure required for the weight goal.;Weight Management: Provide education and appropriate resources to help participant work on and attain dietary goals.    Expected Outcomes Short Term: Continue to assess and modify interventions until short term weight is achieved;Long Term: Adherence to nutrition and physical activity/exercise program aimed toward attainment of established weight goal;Understanding recommendations for meals to include 15-35% energy as protein, 25-35% energy from fat, 35-60% energy from carbohydrates, less than 200mg  of dietary cholesterol, 20-35 gm of total fiber daily;Understanding of distribution of calorie intake throughout the day with the consumption of 4-5 meals/snacks    Hypertension Yes    Intervention Provide education on lifestyle modifcations including regular physical activity/exercise, weight management, moderate sodium restriction and increased consumption of fresh fruit, vegetables, and low fat dairy, alcohol moderation, and smoking cessation.;Monitor prescription use compliance.    Expected Outcomes Short Term: Continued assessment and intervention until BP is < 140/48mm HG in hypertensive participants. < 130/3mm HG in hypertensive  participants with diabetes, heart failure or chronic kidney disease.;Long Term: Maintenance of blood pressure at goal levels.    Lipids Yes    Intervention Provide education and support for participant on nutrition & aerobic/resistive exercise along with prescribed medications to achieve LDL 70mg , HDL >40mg .    Expected Outcomes Short Term: Participant states understanding of desired cholesterol values and is compliant with medications prescribed. Participant is following exercise prescription and nutrition guidelines.;Long Term: Cholesterol controlled with medications as prescribed, with individualized exercise RX and with personalized nutrition plan. Value goals: LDL < 70mg , HDL > 40 mg.    Stress Yes    Intervention Offer individual and/or small group education and counseling on adjustment to heart disease, stress management and health-related lifestyle change. Teach and support self-help strategies.;Refer participants experiencing significant psychosocial distress to appropriate mental health specialists for further evaluation and treatment. When possible, include family members and significant others in education/counseling sessions.    Expected Outcomes Short Term: Participant demonstrates changes in health-related behavior, relaxation and other stress management skills, ability to  obtain effective social support, and compliance with psychotropic medications if prescribed.;Long Term: Emotional wellbeing is indicated by absence of clinically significant psychosocial distress or social isolation.             Core Components/Risk Factors/Patient Goals Review:    Core Components/Risk Factors/Patient Goals at Discharge (Final Review):    ITP Comments:  ITP Comments     Row Name 08/02/23 1415           ITP Comments Dr. Gaylyn Keas medical director. Introduction to pritikin education/intensive cardiac rehab. Initial orientation packet reviewed with patient.                 Comments: Participant attended orientation for the cardiac rehabilitation program on  08/02/2023  to perform initial intake and exercise walk test. Patient introduced to the [facility] education and orientation packet was reviewed. Completed 6-minute walk test, measurements, initial ITP, and exercise prescription. Vital signs stable. Telemetry-normal sinus rhythm, asymptomatic.   Service time was from 1310 to 1510.  Arlander Labrum, MS, ACSM-CEP 08/02/2023 3:42 PM

## 2023-08-02 NOTE — Progress Notes (Signed)
 Cardiac Rehab Medication Review   Does the patient  feel that his/her medications are working for him/her? Yes    Has the patient been experiencing any side effects to the medications prescribed? Yes  Does the patient measure his/her own blood pressure or blood glucose at home?   Yes  Does the patient have any problems obtaining medications due to transportation or finances?   No  Understanding of regimen: excellent Understanding of indications: excellent Potential of compliance: excellent    Comments: Jesus [name] has a good understanding of his medications and regime. He checks his BP multiple times a day. He has had some significant myalgias/cramping with his statin. He has discussed this with his MD.    Arlander Labrum, MS, ACSM-CEP 08/02/2023 2:31 PM

## 2023-08-11 ENCOUNTER — Encounter (HOSPITAL_COMMUNITY)
Admission: RE | Admit: 2023-08-11 | Discharge: 2023-08-11 | Disposition: A | Discharge status: Home / Self Care | Source: Ambulatory Visit | Attending: Cardiology

## 2023-08-11 DIAGNOSIS — Z955 Presence of coronary angioplasty implant and graft: Secondary | ICD-10-CM

## 2023-08-11 NOTE — Progress Notes (Signed)
 Daily Session Note  Patient Details  Name: Jesus [name] MRN: [id number] Date of Birth: [birth year] Referring Provider:   Flowsheet Row INTENSIVE CARDIAC REHAB ORIENT from 08/02/2023 in [name]  Referring Provider Knox Perl, MD       Encounter Date: 08/11/2023  Check In:  Session Check In - 08/11/23 1617       Check-In   Supervising physician immediately available to respond to emergencies [facility] MD immediately available    Physician(s) Marlana Silvan, NP    Location [facility]    Staff Present Hilbert Loving, MS, ACSM-CEP, Exercise Physiologist;Johnny Alexia Angelucci, MS, Exercise Physiologist;Casey Felipe Horton, RT;Jetta Walker BS, ACSM-CEP, Exercise Physiologist;Laporcha Marchesi, RN, BSN    Virtual Visit No    Medication changes reported     No    Fall or balance concerns reported    No    Tobacco Cessation No Change    Warm-up and Cool-down Performed as group-led instruction    Resistance Training Performed No    VAD Patient? No    PAD/SET Patient? No      Pain Assessment   Currently in Pain? No/denies    Pain Score 0-No pain    Multiple Pain Sites No             Capillary Blood Glucose: No results found for this or any previous visit (from the past 24 hours).   Exercise Prescription Changes - 08/11/23 1645       Response to Exercise   Blood Pressure (Admit) 114/64    Blood Pressure (Exercise) 138/68    Blood Pressure (Exit) 114/68    Heart Rate (Admit) 75 bpm    Heart Rate (Exercise) 126 bpm    Heart Rate (Exit) 76 bpm    Rating of Perceived Exertion (Exercise) 12    Perceived Dyspnea (Exercise) 0    Symptoms 0    Comments Pt first day in the [facility] program    Duration Progress to 30 minutes of  aerobic without signs/symptoms of physical distress    Intensity THRR unchanged      Progression   Progression Continue to progress workloads to maintain intensity without signs/symptoms of  physical distress.    Average METs 3.2      Resistance Training   Training Prescription No    Weight 4    Reps 10-15    Time 0 Minutes      Treadmill   MPH 3    Grade 0    Minutes 15    METs 3.3      Bike   Level 2    Watts 30    Minutes 71    METs 3.1             Social History   Tobacco Use  Smoking Status Never  Smokeless Tobacco Never    Goals Met:  Exercise tolerated well No report of concerns or symptoms today  Goals Unmet:  Not Applicable  Comments: Pt started cardiac rehab today.  Pt tolerated light exercise without difficulty. VSS, telemetry-Sinus Rhythm, asymptomatic.  Medication list reconciled. Pt denies barriers to medicaiton compliance.  PSYCHOSOCIAL ASSESSMENT:  PHQ-4. Pt exhibits positive coping skills, hopeful outlook with supportive family. No psychosocial needs identified at this time, no psychosocial interventions necessary.    Pt enjoys gardening.   Pt oriented to exercise equipment and routine.    Understanding verbalized. [name] RN BSN  Dr. Gaylyn Keas is Medical Director for Cardiac Rehab at [facility].

## 2023-08-13 ENCOUNTER — Encounter (HOSPITAL_COMMUNITY)
Admission: RE | Admit: 2023-08-13 | Discharge: 2023-08-13 | Disposition: A | Discharge status: Home / Self Care | Source: Ambulatory Visit | Attending: Cardiology | Admitting: Cardiology

## 2023-08-13 DIAGNOSIS — Z955 Presence of coronary angioplasty implant and graft: Secondary | ICD-10-CM | POA: Diagnosis not present

## 2023-08-16 ENCOUNTER — Encounter (HOSPITAL_COMMUNITY)
Admission: RE | Admit: 2023-08-16 | Discharge: 2023-08-16 | Disposition: A | Discharge status: Home / Self Care | Source: Ambulatory Visit | Attending: Cardiology | Admitting: Cardiology

## 2023-08-16 DIAGNOSIS — Z955 Presence of coronary angioplasty implant and graft: Secondary | ICD-10-CM | POA: Insufficient documentation

## 2023-08-16 NOTE — Telephone Encounter (Signed)
 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

## 2023-08-18 ENCOUNTER — Encounter (HOSPITAL_COMMUNITY)
Admission: RE | Admit: 2023-08-18 | Discharge: 2023-08-18 | Disposition: A | Discharge status: Home / Self Care | Source: Ambulatory Visit | Attending: Cardiology

## 2023-08-18 DIAGNOSIS — Z955 Presence of coronary angioplasty implant and graft: Secondary | ICD-10-CM | POA: Diagnosis not present

## 2023-08-20 ENCOUNTER — Encounter (HOSPITAL_COMMUNITY)
Admission: RE | Admit: 2023-08-20 | Discharge: 2023-08-20 | Disposition: A | Discharge status: Home / Self Care | Source: Ambulatory Visit | Attending: Cardiology | Admitting: Cardiology

## 2023-08-20 DIAGNOSIS — Z955 Presence of coronary angioplasty implant and graft: Secondary | ICD-10-CM | POA: Diagnosis not present

## 2023-08-23 ENCOUNTER — Encounter (HOSPITAL_COMMUNITY)
Admission: RE | Admit: 2023-08-23 | Discharge: 2023-08-23 | Disposition: A | Discharge status: Home / Self Care | Source: Ambulatory Visit | Attending: Cardiology

## 2023-08-23 DIAGNOSIS — Z955 Presence of coronary angioplasty implant and graft: Secondary | ICD-10-CM

## 2023-08-23 NOTE — Progress Notes (Unsigned)
 Office Visit    Patient Name: Jesus [name] Date of Encounter: 08/23/2023  Primary Care Provider:  Victorio Grave, MD Primary Cardiologist:  Knox Perl, MD  Chief Complaint    Hyperlipidemia   Significant Past Medical History   CAD 2/25 CAC 1661 (90th percentile) 4/25 cath DES to MLCx, diffuse LAD disease                 Allergies  Allergen Reactions   Fluoxetine Other (See Comments)    Jumpy   Paroxetine Other (See Comments)    Jumpy   Sertraline Rash    History of Present Illness    Jesus [name] is a 58 y.o. male patient of Dr Berry Bristol, in the office today to discuss options for cholesterol management.  Insurance Carrier:   Pharmacy:     [facility]:      LDL Cholesterol goal:    Current Medications:   rosuvastatin  20, ezetimibe  10  Previously tried:  atorvastatin   Family Hx:     Social Hx: Tobacco: Alcohol:      Diet:      Exercise:   Adherence Assessment  Do you ever forget to take your medication? [] Yes [] No  Do you ever skip doses due to side effects? [] Yes [] No  Do you have trouble affording your medicines? [] Yes [] No  Are you ever unable to pick up your medication due to transportation difficulties? [] Yes [] No  Do you ever stop taking your medications because you don't believe they are helping? [] Yes [] No  Do you check your weight daily? [] Yes [] No   Adherence strategy: ***  Barriers to obtaining medications: ***     Accessory Clinical Findings   Lab Results  Component Value Date   CHOL 117 06/22/2023   HDL 46 06/22/2023   LDLCALC 59 06/22/2023   TRIG 49 06/22/2023   CHOLHDL 2.5 06/22/2023    No results found for: "LIPOA"  Lab Results  Component Value Date   ALT 32 07/01/2023   AST 36 07/01/2023   ALKPHOS 48 07/01/2023   BILITOT 0.6 07/01/2023   Lab Results  Component Value Date   CREATININE 1.00 07/01/2023   BUN 11 07/01/2023   NA 137 07/01/2023   K 4.0 07/01/2023   CL 101 07/01/2023   CO2 23  07/01/2023   No results found for: "HGBA1C"  Home Medications    Current Outpatient Medications  Medication Sig Dispense Refill   ALPRAZolam (XANAX) 0.25 MG tablet Take 0.25 mg by mouth at bedtime as needed for anxiety.     aspirin  (ASPIRIN  CHILDRENS) 81 MG chewable tablet Chew 1 tablet (81 mg total) by mouth daily.     Cholecalciferol (VITAMIN D -3) 25 MCG (1000 UT) CAPS Take 1 capsule by mouth daily.     clopidogrel  (PLAVIX ) 75 MG tablet Take 75 mg by mouth daily.     Coenzyme Q10 (COQ10) 100 MG CAPS Take 100 mg by mouth daily.     ezetimibe  (ZETIA ) 10 MG tablet Take 1 tablet (10 mg total) by mouth daily. 90 tablet 3   fluticasone (FLONASE) 50 MCG/ACT nasal spray Place 2 sprays into both nostrils as needed for allergies or rhinitis.     loratadine (CLARITIN) 10 MG tablet Take 10 mg by mouth daily.     losartan  (COZAAR ) 25 MG tablet Take 0.5 tablets (12.5 mg total) by mouth every evening.     Magnesium Oxide -Mg Supplement 500 MG CAPS Take 1 capsule by mouth daily. (Patient not taking: Reported  on 08/02/2023)     meclizine  (ANTIVERT ) 25 MG tablet Take 1 tablet (25 mg total) by mouth 3 (three) times daily as needed for dizziness. 20 tablet 0   Multiple Vitamin (MULTIVITAMIN) tablet Take 1 tablet by mouth daily.     nitroGLYCERIN  (NITROSTAT ) 0.4 MG SL tablet Place 1 tablet (0.4 mg total) under the tongue every 5 (five) minutes as needed. 25 tablet 2   pantoprazole  (PROTONIX ) 20 MG tablet Take 20 mg daily on a empty stomach 30 tablet 2   polyethylene glycol (MIRALAX / GLYCOLAX) 17 g packet Take 17 g by mouth daily.     propranolol (INDERAL) 20 MG tablet Take 10 mg by mouth 2 (two) times daily.     rosuvastatin  (CRESTOR ) 20 MG tablet Take 1 tablet (20 mg total) by mouth daily. 90 tablet 3   No current facility-administered medications for this visit.     Assessment & Plan    No problem-specific Assessment & Plan notes found for this encounter.   Loukisha Gunnerson, PharmD CPP [facility] [address]   [place], [place] [address] [phone]  08/23/2023, 4:21 PM

## 2023-08-24 ENCOUNTER — Ambulatory Visit: Attending: Cardiology | Admitting: Pharmacist Clinician (PhC)/ Clinical Pharmacy Specialist

## 2023-08-24 ENCOUNTER — Encounter: Payer: Self-pay | Admitting: Pharmacist Clinician (PhC)/ Clinical Pharmacy Specialist

## 2023-08-24 DIAGNOSIS — E785 Hyperlipidemia, unspecified: Secondary | ICD-10-CM

## 2023-08-24 NOTE — Assessment & Plan Note (Signed)
 Assessment: Patient with ASCVD not at LDL goal of < 70 LDL 169 on 04/16/23 Has been compliant with ezetimibe  10 mg Not able to tolerate statins secondary to myalgias - atorvastatin , rosuvastatin  Reviewed options for lowering LDL cholesterol, including PCSK-9 inhibitors, bempedoic acid and inclisiran.  Discussed mechanisms of action, dosing, side effects, potential decreases in LDL cholesterol and costs.  Also reviewed potential options for patient assistance.  Plan: Patient agreeable to starting Repatha 140 mg q14d Continue ezetimibe  10 mg daily Repeat labs after:  3 months Lipid Liver function Patient was given information on [employer] - will determine need when PA approved Marital status Income < $72,000 (single) or < $102,000 (married)

## 2023-08-24 NOTE — Patient Instructions (Signed)
 Your Results:             Your most recent labs Goal  Total Cholesterol 235 < 200  Triglycerides 97 < 150  HDL (happy/good cholesterol) 49 > 40  LDL (lousy/bad cholesterol 169 < 70   Medication changes:  Continue ezetimibe   We will start the process to get Repatha covered by your insurance.  Once the prior authorization is complete, I will call/send a MyChart message to let you know and confirm pharmacy information.   You will take one injection every 14 days  Lab orders:  We want to repeat labs after 2-3 months.  We will send you a lab order to remind you once we get closer to that time.    Patient Assistance:    We can sign you up for a [facility] Grant once your medication is approved by [employer].     ID   BIN [id number]  PCN [id number]  GRP [id number]    Thank you for choosing [facility]

## 2023-08-25 ENCOUNTER — Encounter (HOSPITAL_COMMUNITY)
Admission: RE | Admit: 2023-08-25 | Discharge: 2023-08-25 | Disposition: A | Discharge status: Home / Self Care | Source: Ambulatory Visit | Attending: Cardiology | Admitting: Cardiology

## 2023-08-25 DIAGNOSIS — Z955 Presence of coronary angioplasty implant and graft: Secondary | ICD-10-CM | POA: Diagnosis not present

## 2023-08-27 ENCOUNTER — Encounter (HOSPITAL_COMMUNITY)
Admission: RE | Admit: 2023-08-27 | Discharge: 2023-08-27 | Disposition: A | Discharge status: Home / Self Care | Source: Ambulatory Visit | Attending: Cardiology | Admitting: Cardiology

## 2023-08-27 DIAGNOSIS — Z955 Presence of coronary angioplasty implant and graft: Secondary | ICD-10-CM

## 2023-08-27 DIAGNOSIS — H8111 Benign paroxysmal vertigo, right ear: Secondary | ICD-10-CM | POA: Diagnosis not present

## 2023-08-27 NOTE — Progress Notes (Signed)
 CARDIAC REHAB PHASE 2  Reviewed home exercise with pt today. Pt is tolerating exercise well. Pt will continue to exercise on his own by walking for ~30 minutes per session 2-3 days a week in addition to the 3 days in CRP2. Advised pt on THRR, RPE scale, hydration and temperature/humidity precautions. Reinforced NTG use, S/S to stop exercise and when to call MD vs 911. Encouraged warm up cool down and stretches with exercise sessions. Pt verbalized understanding, all questions were answered and pt was given a copy to take home.    Darl Edu ACSM-CEP 08/27/2023 5:05 PM

## 2023-08-30 ENCOUNTER — Encounter (HOSPITAL_COMMUNITY)
Admission: RE | Admit: 2023-08-30 | Discharge: 2023-08-30 | Disposition: A | Discharge status: Home / Self Care | Source: Ambulatory Visit | Attending: Cardiology | Admitting: Cardiology

## 2023-08-30 ENCOUNTER — Telehealth (HOSPITAL_COMMUNITY): Payer: Self-pay

## 2023-08-30 DIAGNOSIS — Z955 Presence of coronary angioplasty implant and graft: Secondary | ICD-10-CM | POA: Diagnosis not present

## 2023-08-30 NOTE — Telephone Encounter (Signed)
-----   Message from Knox Perl sent at 08/27/2023  9:37 PM EDT ----- Regarding: RE: THRR and jog request He can resume full activity without restriction ----- Message ----- From: Darl Edu Sent: 08/27/2023   5:11 PM EDT To: Knox Perl, MD Subject: THRR and jog request                           CARDIAC REHAB PHASE 2 Our mutual patient Jesus [name] has been in the cardiac rehabilitation program approximately 3 weeks and is doing well. Patient would like to begin jogging. His current MET level is 3.46. Blood pressure is within normal limits, but exercise heart rates with the jogging may begin to exceed Target Heart Rate Range(THRR) of 64-129 bpm (40%-80% of age predicted max HR).  If no GXT is planned for the near future and MD agrees, request to increase THR to 40% -90% of age predicted max HR 64-145 bpm. Please indicate if you are agreeable to this change in exercise prescription.  We appreciate your assistance and referral of your patient to our program. Darl Edu ACSM-CEP 08/27/2023 5:11 PM

## 2023-08-31 ENCOUNTER — Encounter: Payer: Self-pay | Admitting: Pharmacist Clinician (PhC)/ Clinical Pharmacy Specialist

## 2023-08-31 DIAGNOSIS — E785 Hyperlipidemia, unspecified: Secondary | ICD-10-CM

## 2023-09-01 ENCOUNTER — Telehealth: Payer: Self-pay | Admitting: Pharmacy Technician

## 2023-09-01 ENCOUNTER — Encounter (HOSPITAL_COMMUNITY)
Admission: RE | Admit: 2023-09-01 | Discharge: 2023-09-01 | Disposition: A | Discharge status: Home / Self Care | Source: Ambulatory Visit | Attending: Cardiology | Admitting: Cardiology

## 2023-09-01 ENCOUNTER — Other Ambulatory Visit (HOSPITAL_COMMUNITY): Payer: Self-pay

## 2023-09-01 ENCOUNTER — Telehealth (HOSPITAL_BASED_OUTPATIENT_CLINIC_OR_DEPARTMENT_OTHER): Payer: Self-pay | Admitting: Pharmacist Clinician (PhC)/ Clinical Pharmacy Specialist

## 2023-09-01 DIAGNOSIS — Z955 Presence of coronary angioplasty implant and graft: Secondary | ICD-10-CM

## 2023-09-01 NOTE — Progress Notes (Signed)
 Cardiac Individual Treatment Plan  Patient Details  Name: Jesus [name] MRN: [id number] Date of Birth: Jun 17, 1965 Referring Provider:   Flowsheet Row INTENSIVE CARDIAC REHAB ORIENT from 08/02/2023 in [facility]  Referring Provider Knox Perl, MD    Initial Encounter Date:  Flowsheet Row INTENSIVE CARDIAC REHAB ORIENT from 08/02/2023 in [facility]  Date 08/02/23    Visit Diagnosis: 06/28/23 DES LCx  Patient's Home Medications on Admission:  Current Outpatient Medications:    ALPRAZolam (XANAX) 0.25 MG tablet, Take 0.25 mg by mouth at bedtime as needed for anxiety., Disp: , Rfl:    aspirin  (ASPIRIN  CHILDRENS) 81 MG chewable tablet, Chew 1 tablet (81 mg total) by mouth daily., Disp: , Rfl:    clopidogrel  (PLAVIX ) 75 MG tablet, Take 75 mg by mouth daily., Disp: , Rfl:    ezetimibe  (ZETIA ) 10 MG tablet, Take 1 tablet (10 mg total) by mouth daily., Disp: 90 tablet, Rfl: 3   fluticasone (FLONASE) 50 MCG/ACT nasal spray, Place 2 sprays into both nostrils as needed for allergies or rhinitis., Disp: , Rfl:    loratadine (CLARITIN) 10 MG tablet, Take 10 mg by mouth daily., Disp: , Rfl:    meclizine  (ANTIVERT ) 25 MG tablet, Take 1 tablet (25 mg total) by mouth 3 (three) times daily as needed for dizziness., Disp: 20 tablet, Rfl: 0   Multiple Vitamin (MULTIVITAMIN) tablet, Take 1 tablet by mouth daily., Disp: , Rfl:    nitroGLYCERIN  (NITROSTAT ) 0.4 MG SL tablet, Place 1 tablet (0.4 mg total) under the tongue every 5 (five) minutes as needed., Disp: 25 tablet, Rfl: 2   propranolol (INDERAL) 20 MG tablet, Take 10 mg by mouth 2 (two) times daily., Disp: , Rfl:   Past Medical History: Past Medical History:  Diagnosis Date   Adenomatous colon polyp    Anxiety    Asthma    Depression    Drug-induced myopathy    Hearing loss    Hematuria    Hematuria    HLD (hyperlipidemia)     Hypercholesterolemia    Mild cognitive impairment    OSA (obstructive sleep apnea)     Tobacco Use: Social History   Tobacco Use  Smoking Status Never  Smokeless Tobacco Never    Labs: Review Flowsheet       Latest Ref Rng & Units 06/22/2023 07/01/2023  Labs for ITP Cardiac and Pulmonary Rehab  Cholestrol 100 - 199 mg/dL 409  -  LDL (calc) 0 - 99 mg/dL 59  -  HDL-C >81 mg/dL 46  -  Trlycerides 0 - 149 mg/dL 49  -  TCO2 22 - 32 mmol/L - 26     Capillary Blood Glucose: Lab Results  Component Value Date   GLUCAP 104 (H) 07/01/2023     Exercise Target Goals: Exercise Program Goal: Individual exercise prescription set using results from initial 6 min walk test and THRR while considering  patient's activity barriers and safety.   Exercise Prescription Goal: Initial exercise prescription builds to 30-45 minutes a day of aerobic activity, 2-3 days per week.  Home exercise guidelines will be given to patient during program as part of exercise prescription that the participant will acknowledge.  Activity Barriers & Risk Stratification:  Activity Barriers & Cardiac Risk Stratification - 08/02/23 1418       Activity Barriers & Cardiac Risk Stratification   Activity Barriers Other (comment)  Comments myalgias/cramps    Cardiac Risk Stratification High   <5 METs on         6 Minute Walk:  6 Minute Walk     Row Name 08/02/23 1539         6 Minute Walk   Phase Initial     Distance 1800 feet     Walk Time 6 minutes     # of Rest Breaks 0     MPH 3.41     METS 3.96     RPE 14     Perceived Dyspnea  0     VO2 Peak 13.87     Symptoms No     Resting HR 66 bpm     Resting BP 116/78     Resting Oxygen Saturation  100 %     Exercise Oxygen Saturation  during 6 min walk 100 %     Max Ex. HR 110 bpm     Max Ex. BP 156/84     2 Minute Post BP 134/76        Oxygen Initial Assessment:   Oxygen Re-Evaluation:   Oxygen Discharge (Final Oxygen  Re-Evaluation):   Initial Exercise Prescription:  Initial Exercise Prescription - 08/02/23 1500       Date of Initial Exercise RX and Referring Provider   Date 08/02/23    Referring Provider Knox Perl, MD    Expected Discharge Date 10/27/23      Treadmill   MPH 3    Grade 0    Minutes 15    METs 2.8      Bike   Level 2    Watts 60    Minutes 15    METs 2.8      Prescription Details   Frequency (times per week) 3    Duration Progress to 30 minutes of continuous aerobic without signs/symptoms of physical distress      Intensity   THRR 40-80% of Max Heartrate 64-129    Ratings of Perceived Exertion 11-13    Perceived Dyspnea 0-4      Progression   Progression Continue progressive overload as per policy without signs/symptoms or physical distress.      Resistance Training   Training Prescription Yes    Weight 4    Reps 10-15          Perform Capillary Blood Glucose checks as needed.  Exercise Prescription Changes:   Exercise Prescription Changes     Row Name 08/11/23 1645 08/27/23 1657           Response to Exercise   Blood Pressure (Admit) 114/64 124/70      Blood Pressure (Exercise) 138/68 132/72      Blood Pressure (Exit) 114/68 108/68      Heart Rate (Admit) 75 bpm 77 bpm      Heart Rate (Exercise) 126 bpm 90 bpm      Heart Rate (Exit) 76 bpm 66 bpm      Rating of Perceived Exertion (Exercise) 12 11      Perceived Dyspnea (Exercise) 0 0      Symptoms 0 0      Comments Pt first day in the [facility] program Reviewed MET's, goals and home ExRx      Duration Progress to 30 minutes of  aerobic without signs/symptoms of physical distress Progress to 30 minutes of  aerobic without signs/symptoms of physical distress      Intensity THRR unchanged THRR unchanged  Progression   Progression Continue to progress workloads to maintain intensity without signs/symptoms of physical distress. Continue to progress workloads to maintain intensity without  signs/symptoms of physical distress.      Average METs 3.2 3.46        Resistance Training   Training Prescription No Yes      Weight 4 4      Reps 10-15 10-15      Time 0 Minutes 0 Minutes        Treadmill   MPH 3 3      Grade 0 1      Minutes 15 15      METs 3.3 3.71        Bike   Level 2 2.6  did not get MET's today, stats taken from last session      Watts 30 34      Minutes 71 68      METs 3.1 3.2        Home Exercise Plan   Plans to continue exercise at -- Home (comment)      Frequency -- Add 3 additional days to program exercise sessions.      Initial Home Exercises Provided -- 08/27/23         Exercise Comments:   Exercise Comments     Row Name 08/11/23 1649 08/27/23 1705         Exercise Comments Pt first day in the [facility] Program. Pt tolerated exercise well with an average MET level of 3.2. Pt is off to a good start and is learning his THRR, RPE and ExRx. Will continue to monitor pt and progress workloads as toleratred without sign or symptom Reviewed MET's, goals and home ExRx. Pt tolerated exercise well with an average MET level of 3.46. Pt is feeling good with exercise and talked about progression today, he walks quite fast when at home, so working on gradual increases on the TM here. He is interested in maybe interval training. will send in request. He's already walking on his own for exercise 2-3 days for 30 mins. He's happy with his progress and says his walks at home have become better, he's not as short of breath and his HR is lower.         Exercise Goals and Review:   Exercise Goals     Row Name 08/02/23 1419             Exercise Goals   Increase Physical Activity Yes       Intervention Provide advice, education, support and counseling about physical activity/exercise needs.;Develop an individualized exercise prescription for aerobic and resistive training based on initial evaluation findings, risk stratification, comorbidities and  participant's personal goals.       Expected Outcomes Short Term: Attend rehab on a regular basis to increase amount of physical activity.;Long Term: Exercising regularly at least 3-5 days a week.;Long Term: Add in home exercise to make exercise part of routine and to increase amount of physical activity.       Increase Strength and Stamina Yes       Intervention Develop an individualized exercise prescription for aerobic and resistive training based on initial evaluation findings, risk stratification, comorbidities and participant's personal goals.;Provide advice, education, support and counseling about physical activity/exercise needs.       Expected Outcomes Short Term: Increase workloads from initial exercise prescription for resistance, speed, and METs.;Short Term: Perform resistance training exercises routinely during rehab and add in resistance training  at home;Long Term: Improve cardiorespiratory fitness, muscular endurance and strength as measured by increased METs and functional capacity ( )       Able to understand and use rate of perceived exertion (RPE) scale Yes       Intervention Provide education and explanation on how to use RPE scale       Expected Outcomes Long Term:  Able to use RPE to guide intensity level when exercising independently;Short Term: Able to use RPE daily in rehab to express subjective intensity level       Knowledge and understanding of Target Heart Rate Range (THRR) Yes       Intervention Provide education and explanation of THRR including how the numbers were predicted and where they are located for reference       Expected Outcomes Short Term: Able to state/look up THRR;Short Term: Able to use daily as guideline for intensity in rehab;Long Term: Able to use THRR to govern intensity when exercising independently       Understanding of Exercise Prescription Yes       Intervention Provide education, explanation, and written materials on patient's individual exercise  prescription       Expected Outcomes Short Term: Able to explain program exercise prescription;Long Term: Able to explain home exercise prescription to exercise independently          Exercise Goals Re-Evaluation :  Exercise Goals Re-Evaluation     Row Name 08/11/23 1648 08/27/23 1701           Exercise Goal Re-Evaluation   Exercise Goals Review Increase Physical Activity;Understanding of Exercise Prescription;Increase Strength and Stamina;Knowledge and understanding of Target Heart Rate Range (THRR);Able to understand and use rate of perceived exertion (RPE) scale Increase Physical Activity;Understanding of Exercise Prescription;Increase Strength and Stamina;Knowledge and understanding of Target Heart Rate Range (THRR);Able to understand and use rate of perceived exertion (RPE) scale      Comments Pt first day in the [facility] Program. Pt tolerated exercise well with an average MET level of 3.2. Pt is off to a good start and is learning his THRR, RPE and ExRx. Reviewed MET's, goals and home ExRx. Pt tolerated exercise well with an average MET level of 3.46. Pt is feeling good with exercise and talked about progression today, he walks quite fast when at home, so working on gradual increases on the TM here. He is interested in maybe interval training. will send in request. He's already walking on his own for exercise 2-3 days for 30 mins. He's happy with his progress and says his walks at home have become better, he's not as short of breath and his HR is lower.      Expected Outcomes Will continue to monitor pt and progress workloads as toleratred without sign or symptom Will continue to monitor pt and progress workloads as toleratred without sign or symptom         Discharge Exercise Prescription (Final Exercise Prescription Changes):  Exercise Prescription Changes - 08/27/23 1657       Response to Exercise   Blood Pressure (Admit) 124/70    Blood Pressure (Exercise) 132/72    Blood  Pressure (Exit) 108/68    Heart Rate (Admit) 77 bpm    Heart Rate (Exercise) 90 bpm    Heart Rate (Exit) 66 bpm    Rating of Perceived Exertion (Exercise) 11    Perceived Dyspnea (Exercise) 0    Symptoms 0    Comments Reviewed MET's, goals and home ExRx  Duration Progress to 30 minutes of  aerobic without signs/symptoms of physical distress    Intensity THRR unchanged      Progression   Progression Continue to progress workloads to maintain intensity without signs/symptoms of physical distress.    Average METs 3.46      Resistance Training   Training Prescription Yes    Weight 4    Reps 10-15    Time 0 Minutes      Treadmill   MPH 3    Grade 1    Minutes 15    METs 3.71      Bike   Level 2.6   did not get MET's today, stats taken from last session   Watts 34    Minutes 68    METs 3.2      Home Exercise Plan   Plans to continue exercise at Home (comment)    Frequency Add 3 additional days to program exercise sessions.    Initial Home Exercises Provided 08/27/23          Nutrition:  Target Goals: Understanding of nutrition guidelines, daily intake of sodium 1500mg , cholesterol 200mg , calories 30% from fat and 7% or less from saturated fats, daily to have 5 or more servings of fruits and vegetables.  Biometrics:    Nutrition Therapy Plan and Nutrition Goals:  Nutrition Therapy & Goals - 08/12/23 1332       Nutrition Therapy   Diet Heart Healthy Diet    Drug/Food Interactions Statins/Certain Fruits      Personal Nutrition Goals   Nutrition Goal Patient to identify strategies for reducing cardiovascular risk by attending the [facility] education and nutrition series weekly.    Personal Goal #2 Patient to improve diet quality by using the plate method as a guide for meal planning to include lean protein/plant protein, fruits, vegetables, whole grains, nonfat dairy as part of a well-balanced diet.    Comments Patient has medical history of  hypercholesterolemia,  OSA on CPAP, stent placement. LDL is well controlled at this time (zetia , crestor ). He is down 20.5# since hospitalization in March (05/25/23, 209#); he reports some fear of what foods to eat and many dietary changes contributing to weight loss. Patient will benefit from participation in intensive cardiac rehab for nutrition, exercise, and lifestyle modification.      Intervention Plan   Intervention Prescribe, educate and counsel regarding individualized specific dietary modifications aiming towards targeted core components such as weight, hypertension, lipid management, diabetes, heart failure and other comorbidities.;Nutrition handout(s) given to patient.    Expected Outcomes Short Term Goal: Understand basic principles of dietary content, such as calories, fat, sodium, cholesterol and nutrients.;Long Term Goal: Adherence to prescribed nutrition plan.          Nutrition Assessments:  Nutrition Assessments - 08/13/23 1639       Rate Your Plate Scores   Pre Score 90         MEDIFICTS Score Key: >=70 Need to make dietary changes  40-70 Heart Healthy Diet <= 40 Therapeutic Level Cholesterol Diet   Flowsheet Row INTENSIVE CARDIAC REHAB from 08/13/2023 in [facility]  Picture Your Plate Total Score on Admission 90   Picture Your Plate Scores: <11 Unhealthy dietary pattern with much room for improvement. 41-50 Dietary pattern unlikely to meet recommendations for good health and room for improvement. 51-60 More healthful dietary pattern, with some room for improvement.  >60 Healthy dietary pattern, although there may  be some specific behaviors that could be improved.    Nutrition Goals Re-Evaluation:  Nutrition Goals Re-Evaluation     Row Name 08/12/23 1332             Goals   Current Weight 188 lb 15 oz (85.7 kg)       Comment LDL 59, HD L46,       Expected Outcome Patient has medical history of  hypercholesterolemia, OSA on CPAP, stent placement. LDL is well controlled at this time (zetia , crestor ). He is down 20.5# since hospitalization in March (05/25/23, 209#); he reports some fear of what foods to eat and many dietary changes contributing to weight loss. Patient will benefit from participation in intensive cardiac rehab for nutrition, exercise, and lifestyle modification.          Nutrition Goals Re-Evaluation:  Nutrition Goals Re-Evaluation     Row Name 08/12/23 1332             Goals   Current Weight 188 lb 15 oz (85.7 kg)       Comment LDL 59, HD L46,       Expected Outcome Patient has medical history of hypercholesterolemia, OSA on CPAP, stent placement. LDL is well controlled at this time (zetia , crestor ). He is down 20.5# since hospitalization in March (05/25/23, 209#); he reports some fear of what foods to eat and many dietary changes contributing to weight loss. Patient will benefit from participation in intensive cardiac rehab for nutrition, exercise, and lifestyle modification.          Nutrition Goals Discharge (Final Nutrition Goals Re-Evaluation):  Nutrition Goals Re-Evaluation - 08/12/23 1332       Goals   Current Weight 188 lb 15 oz (85.7 kg)    Comment LDL 59, HD L46,    Expected Outcome Patient has medical history of hypercholesterolemia, OSA on CPAP, stent placement. LDL is well controlled at this time (zetia , crestor ). He is down 20.5# since hospitalization in March (05/25/23, 209#); he reports some fear of what foods to eat and many dietary changes contributing to weight loss. Patient will benefit from participation in intensive cardiac rehab for nutrition, exercise, and lifestyle modification.          Psychosocial: Target Goals: Acknowledge presence or absence of significant depression and/or stress, maximize coping skills, provide positive support system. Participant is able to verbalize types and ability to use techniques and skills needed for  reducing stress and depression.  Initial Review & Psychosocial Screening:  Initial Psych Review & Screening - 08/02/23 1419       Initial Review   Current issues with Current Anxiety/Panic;Current Stress Concerns    Source of Stress Concerns Chronic Illness;Unable to participate in former interests or hobbies    Comments Jesus [name] shared that he has had some anxiety/stress since his stent placement. He has made a lot of changes to his diet and exercise since his stent placement, but figuring out what is the right thing for him to do has been a challenge for him an his wife and stressful at times. He is excited to be in the program and learn more. He also shared that communicating with other family and friends can be overwhelming when discussing health issues. He does not feel he needs additional resources at this time.      Family Dynamics   Good Support System? Yes   wife     Barriers   Psychosocial barriers to participate in program The patient should benefit from  training in stress management and relaxation.      Screening Interventions   Interventions Provide feedback about the scores to participant;To provide support and resources with identified psychosocial needs;Encouraged to exercise    Expected Outcomes Long Term goal: The participant improves quality of Life and PHQ9 Scores as seen by post scores and/or verbalization of changes;Short Term goal: Identification and review with participant of any Quality of Life or Depression concerns found by scoring the questionnaire.;Long Term Goal: Stressors or current issues are controlled or eliminated.          Quality of Life Scores:  Quality of Life - 08/02/23 1540       Quality of Life   Select Quality of Life      Quality of Life Scores   Health/Function Pre 17.46 %    Socioeconomic Pre 22.43 %    Psych/Spiritual Pre 18.43 %    Family Pre 16.75 %    GLOBAL Pre 18.71 %         Scores of 19 and below usually indicate a poorer  quality of life in these areas.  A difference of  2-3 points is a clinically meaningful difference.  A difference of 2-3 points in the total score of the Quality of Life Index has been associated with significant improvement in overall quality of life, self-image, physical symptoms, and general health in studies assessing change in quality of life.  PHQ-9: Review Flowsheet       08/02/2023  Depression screen PHQ 2/9  Decreased Interest 1  Down, Depressed, Hopeless 0  PHQ - 2 Score 1  Altered sleeping 0  Tired, decreased energy 2  Change in appetite 0  Feeling bad or failure about yourself  0  Trouble concentrating 1  Moving slowly or fidgety/restless 0  Suicidal thoughts 0  PHQ-9 Score 4  Difficult doing work/chores Somewhat difficult   Interpretation of Total Score  Total Score Depression Severity:  1-4 = Minimal depression, 5-9 = Mild depression, 10-14 = Moderate depression, 15-19 = Moderately severe depression, 20-27 = Severe depression   Psychosocial Evaluation and Intervention:   Psychosocial Re-Evaluation:  Psychosocial Re-Evaluation     Row Name 08/19/23 0817 09/01/23 4696           Psychosocial Re-Evaluation   Current issues with Current Stress Concerns;Current Anxiety/Panic Current Stress Concerns;Current Anxiety/Panic      Comments Jesus [name] has not voiced any increased concerns or stressors during exercise at cardiac rehab. Will review quality of life and PHQ9 in the upcoming week. Quality of life and PHQ9 reviewed.  Jesus [name] denies being depressed. Jesus [name] says his energy level has improved since participating in cardiac rehab. Jesus [name] also says he feels better since his losartan  has been discontinued . Will forward to PCP Dr Camilo Cella.      Expected Outcomes Jesus [name] will have controlled or decreased stressors upon completion of exercise at cardiac rehab. Jesus [name] will have controlled or decreased stressors upon completion of exercise at cardiac rehab.      Interventions Stress management  education;Encouraged to attend Cardiac Rehabilitation for the exercise;Relaxation education Stress management education;Encouraged to attend Cardiac Rehabilitation for the exercise;Relaxation education      Continue Psychosocial Services  Follow up required by staff Follow up required by staff        Initial Review   Source of Stress Concerns Chronic Illness Chronic Illness      Comments Will continue to monitor and offer support as needed Will continue to monitor and offer support  as needed         Psychosocial Discharge (Final Psychosocial Re-Evaluation):  Psychosocial Re-Evaluation - 09/01/23 0929       Psychosocial Re-Evaluation   Current issues with Current Stress Concerns;Current Anxiety/Panic    Comments Quality of life and PHQ9 reviewed.  Jesus [name] denies being depressed. Jesus [name] says his energy level has improved since participating in cardiac rehab. Jesus [name] also says he feels better since his losartan  has been discontinued . Will forward to PCP Dr Camilo Cella.    Expected Outcomes Jesus [name] will have controlled or decreased stressors upon completion of exercise at cardiac rehab.    Interventions Stress management education;Encouraged to attend Cardiac Rehabilitation for the exercise;Relaxation education    Continue Psychosocial Services  Follow up required by staff      Initial Review   Source of Stress Concerns Chronic Illness    Comments Will continue to monitor and offer support as needed          Vocational Rehabilitation: Provide vocational rehab assistance to qualifying candidates.   Vocational Rehab Evaluation & Intervention:  Vocational Rehab - 08/02/23 1425       Initial Vocational Rehab Evaluation & Intervention   Assessment shows need for Vocational Rehabilitation No   Jesus [name] is not working, on disability.         Education: Education Goals: Education classes will be provided on a weekly basis, covering required topics. Participant will state understanding/return demonstration  of topics presented.    Education     Row Name 08/11/23 1600     Education   Cardiac Education Topics [facility]   Orthoptist   Educator Dietitian   Weekly Topic [employer] Desserts   Instruction Review Code 1- Verbalizes Understanding   Class Start Time 1400   Class Stop Time 1437   Class Time Calculation (min) 37 min    Row Name 08/13/23 1600     Education   Cardiac Education Topics [facility]   Licensed conveyancer Nutrition   Nutrition Calorie Density   Instruction Review Code 1- Verbalizes Understanding   Class Start Time 1400   Class Stop Time 1440   Class Time Calculation (min) 40 min    Row Name 08/16/23 1600     Education   Cardiac Education Topics [facility]   Geographical information systems officer Exercise   Exercise Workshop Exercise Basics: Diplomatic Services operational officer   Instruction Review Code 1- Verbalizes Understanding   Class Start Time 1400   Class Stop Time 1450   Class Time Calculation (min) 50 min    Row Name 08/18/23 1400     Education   Cardiac Education Topics [facility]   Customer service manager   Weekly Topic Efficiency Cooking - Meals in a Snap   Instruction Review Code 1- Verbalizes Understanding   Class Start Time 1400   Class Stop Time 1442   Class Time Calculation (min) 42 min    Row Name 08/20/23 1400     Education   Cardiac Education Topics Pritikin   Psychologist, forensic Exercise Education   Exercise Education Move It!   Instruction Review Code 1- Verbalizes Understanding   Class Start Time 1402   Class  Stop Time 1437   Class Time Calculation (min) 35 min    Row Name 08/23/23 1400     Education   Cardiac Education Topics Pritikin   Management consultant Education   General Education Hypertension and Heart Disease   Instruction Review Code 1- Verbalizes Understanding   Class Start Time 1355   Class Stop Time 1443   Class Time Calculation (min) 48 min    Row Name 08/25/23 1500     Education   Cardiac Education Topics Pritikin   Customer service manager   Weekly Topic One-Pot Wonders   Instruction Review Code 1- Verbalizes Understanding   Class Start Time 1400   Class Stop Time 1445   Class Time Calculation (min) 45 min    Row Name 08/27/23 1500     Education   Cardiac Education Topics Pritikin   Glass blower/designer Nutrition   Nutrition Workshop Targeting Your Nutrition Priorities   Instruction Review Code 1- Verbalizes Understanding   Class Start Time 1400   Class Stop Time 1440   Class Time Calculation (min) 40 min    Row Name 08/30/23 1400     Education   Cardiac Education Topics Pritikin   Geographical information systems officer Psychosocial   Psychosocial Workshop Focused Goals, Sustainable Changes   Instruction Review Code 1- Verbalizes Understanding   Class Start Time 1400   Class Stop Time 1439   Class Time Calculation (min) 39 min      Core Videos: Exercise    Move It!  Clinical staff conducted group or individual video education with verbal and written material and guidebook.  Patient learns the recommended Pritikin exercise program. Exercise with the goal of living a long, healthy life. Some of the health benefits of exercise include controlled diabetes, healthier blood pressure levels, improved cholesterol levels, improved heart and lung capacity, improved sleep, and better body composition. Everyone should speak with their doctor before starting or changing an exercise routine.  Biomechanical Limitations Clinical staff conducted group or individual video education with verbal  and written material and guidebook.  Patient learns how biomechanical limitations can impact exercise and how we can mitigate and possibly overcome limitations to have an impactful and balanced exercise routine.  Body Composition Clinical staff conducted group or individual video education with verbal and written material and guidebook.  Patient learns that body composition (ratio of muscle mass to fat mass) is a key component to assessing overall fitness, rather than body weight alone. Increased fat mass, especially visceral belly fat, can put us  at increased risk for metabolic syndrome, type 2 diabetes, heart disease, and even death. It is recommended to combine diet and exercise (cardiovascular and resistance training) to improve your body composition. Seek guidance from your physician and exercise physiologist before implementing an exercise routine.  Exercise Action Plan Clinical staff conducted group or individual video education with verbal and written material and guidebook.  Patient learns the recommended strategies to achieve and enjoy long-term exercise adherence, including variety, self-motivation, self-efficacy, and positive decision making. Benefits of exercise include fitness, good health, weight management, more energy, better sleep, less stress, and overall well-being.  Medical   Heart Disease Risk Reduction Clinical staff conducted group or individual  video education with verbal and written material and guidebook.  Patient learns our heart is our most vital organ as it circulates oxygen, nutrients, white blood cells, and hormones throughout the entire body, and carries waste away. Data supports a plant-based eating plan like the Pritikin Program for its effectiveness in slowing progression of and reversing heart disease. The video provides a number of recommendations to address heart disease.   Metabolic Syndrome and Belly Fat  Clinical staff conducted group or individual video  education with verbal and written material and guidebook.  Patient learns what metabolic syndrome is, how it leads to heart disease, and how one can reverse it and keep it from coming back. You have metabolic syndrome if you have 3 of the following 5 criteria: abdominal obesity, high blood pressure, high triglycerides, low HDL cholesterol, and high blood sugar.  Hypertension and Heart Disease Clinical staff conducted group or individual video education with verbal and written material and guidebook.  Patient learns that high blood pressure, or hypertension, is very common in the [place] . Hypertension is largely due to excessive salt intake, but other important risk factors include being overweight, physical inactivity, drinking too much alcohol, smoking, and not eating enough potassium from fruits and vegetables. High blood pressure is a leading risk factor for heart attack, stroke, congestive heart failure, dementia, kidney failure, and premature death. Long-term effects of excessive salt intake include stiffening of the arteries and thickening of heart muscle and organ damage. Recommendations include ways to reduce hypertension and the risk of heart disease.  Diseases of Our Time - Focusing on Diabetes Clinical staff conducted group or individual video education with verbal and written material and guidebook.  Patient learns why the best way to stop diseases of our time is prevention, through food and other lifestyle changes. Medicine (such as prescription pills and surgeries) is often only a Band-Aid on the problem, not a long-term solution. Most common diseases of our time include obesity, type 2 diabetes, hypertension, heart disease, and cancer. The Pritikin Program is recommended and has been proven to help reduce, reverse, and/or prevent the damaging effects of metabolic syndrome.  Nutrition   Overview of the Pritikin Eating Plan  Clinical staff conducted group or individual video education  with verbal and written material and guidebook.  Patient learns about the Pritikin Eating Plan for disease risk reduction. The Pritikin Eating Plan emphasizes a wide variety of unrefined, minimally-processed carbohydrates, like fruits, vegetables, whole grains, and legumes. Go, Caution, and Stop food choices are explained. Plant-based and lean animal proteins are emphasized. Rationale provided for low sodium intake for blood pressure control, low added sugars for blood sugar stabilization, and low added fats and oils for coronary artery disease risk reduction and weight management.  Calorie Density  Clinical staff conducted group or individual video education with verbal and written material and guidebook.  Patient learns about calorie density and how it impacts the Pritikin Eating Plan. Knowing the characteristics of the food you choose will help you decide whether those foods will lead to weight gain or weight loss, and whether you want to consume more or less of them. Weight loss is usually a side effect of the Pritikin Eating Plan because of its focus on low calorie-dense foods.  Label Reading  Clinical staff conducted group or individual video education with verbal and written material and guidebook.  Patient learns about the Pritikin recommended label reading guidelines and corresponding recommendations regarding calorie density, added sugars, sodium content, and whole grains.  Dining Out - Part 1  Clinical staff conducted group or individual video education with verbal and written material and guidebook.  Patient learns that restaurant meals can be sabotaging because they can be so high in calories, fat, sodium, and/or sugar. Patient learns recommended strategies on how to positively address this and avoid unhealthy pitfalls.  Facts on Fats  Clinical staff conducted group or individual video education with verbal and written material and guidebook.  Patient learns that lifestyle modifications  can be just as effective, if not more so, as many medications for lowering your risk of heart disease. A Pritikin lifestyle can help to reduce your risk of inflammation and atherosclerosis (cholesterol build-up, or plaque, in the artery walls). Lifestyle interventions such as dietary choices and physical activity address the cause of atherosclerosis. A review of the types of fats and their impact on blood cholesterol levels, along with dietary recommendations to reduce fat intake is also included.  Nutrition Action Plan  Clinical staff conducted group or individual video education with verbal and written material and guidebook.  Patient learns how to incorporate Pritikin recommendations into their lifestyle. Recommendations include planning and keeping personal health goals in mind as an important part of their success.  Healthy Mind-Set    Healthy Minds, Bodies, Hearts  Clinical staff conducted group or individual video education with verbal and written material and guidebook.  Patient learns how to identify when they are stressed. Video will discuss the impact of that stress, as well as the many benefits of stress management. Patient will also be introduced to stress management techniques. The way we think, act, and feel has an impact on our hearts.  How Our Thoughts Can Heal Our Hearts  Clinical staff conducted group or individual video education with verbal and written material and guidebook.  Patient learns that negative thoughts can cause depression and anxiety. This can result in negative lifestyle behavior and serious health problems. Cognitive behavioral therapy is an effective method to help control our thoughts in order to change and improve our emotional outlook.  Additional Videos:  Exercise    Improving Performance  Clinical staff conducted group or individual video education with verbal and written material and guidebook.  Patient learns to use a non-linear approach by alternating  intensity levels and lengths of time spent exercising to help burn more calories and lose more body fat. Cardiovascular exercise helps improve heart health, metabolism, hormonal balance, blood sugar control, and recovery from fatigue. Resistance training improves strength, endurance, balance, coordination, reaction time, metabolism, and muscle mass. Flexibility exercise improves circulation, posture, and balance. Seek guidance from your physician and exercise physiologist before implementing an exercise routine and learn your capabilities and proper form for all exercise.  Introduction to Yoga  Clinical staff conducted group or individual video education with verbal and written material and guidebook.  Patient learns about yoga, a discipline of the coming together of mind, breath, and body. The benefits of yoga include improved flexibility, improved range of motion, better posture and core strength, increased lung function, weight loss, and positive self-image. Yoga's heart health benefits include lowered blood pressure, healthier heart rate, decreased cholesterol and triglyceride levels, improved immune function, and reduced stress. Seek guidance from your physician and exercise physiologist before implementing an exercise routine and learn your capabilities and proper form for all exercise.  Medical   Aging: Enhancing Your Quality of Life  Clinical staff conducted group or individual video education with verbal and written material and guidebook.  Patient learns  key strategies and recommendations to stay in good physical health and enhance quality of life, such as prevention strategies, having an advocate, securing a Health Care Proxy and Power of Attorney, and keeping a list of medications and system for tracking them. It also discusses how to avoid risk for bone loss.  Biology of Weight Control  Clinical staff conducted group or individual video education with verbal and written material and  guidebook.  Patient learns that weight gain occurs because we consume more calories than we burn (eating more, moving less). Even if your body weight is normal, you may have higher ratios of fat compared to muscle mass. Too much body fat puts you at increased risk for cardiovascular disease, heart attack, stroke, type 2 diabetes, and obesity-related cancers. In addition to exercise, following the Pritikin Eating Plan can help reduce your risk.  Decoding Lab Results  Clinical staff conducted group or individual video education with verbal and written material and guidebook.  Patient learns that lab test reflects one measurement whose values change over time and are influenced by many factors, including medication, stress, sleep, exercise, food, hydration, pre-existing medical conditions, and more. It is recommended to use the knowledge from this video to become more involved with your lab results and evaluate your numbers to speak with your doctor.   Diseases of Our Time - Overview  Clinical staff conducted group or individual video education with verbal and written material and guidebook.  Patient learns that according to the CDC, 50% to 70% of chronic diseases (such as obesity, type 2 diabetes, elevated lipids, hypertension, and heart disease) are avoidable through lifestyle improvements including healthier food choices, listening to satiety cues, and increased physical activity.  Sleep Disorders Clinical staff conducted group or individual video education with verbal and written material and guidebook.  Patient learns how good quality and duration of sleep are important to overall health and well-being. Patient also learns about sleep disorders and how they impact health along with recommendations to address them, including discussing with a physician.  Nutrition  Dining Out - Part 2 Clinical staff conducted group or individual video education with verbal and written material and guidebook.   Patient learns how to plan ahead and communicate in order to maximize their dining experience in a healthy and nutritious manner. Included are recommended food choices based on the type of restaurant the patient is visiting.   Fueling a Banker conducted group or individual video education with verbal and written material and guidebook.  There is a strong connection between our food choices and our health. Diseases like obesity and type 2 diabetes are very prevalent and are in large-part due to lifestyle choices. The Pritikin Eating Plan provides plenty of food and hunger-curbing satisfaction. It is easy to follow, affordable, and helps reduce health risks.  Menu Workshop  Clinical staff conducted group or individual video education with verbal and written material and guidebook.  Patient learns that restaurant meals can sabotage health goals because they are often packed with calories, fat, sodium, and sugar. Recommendations include strategies to plan ahead and to communicate with the manager, chef, or server to help order a healthier meal.  Planning Your Eating Strategy  Clinical staff conducted group or individual video education with verbal and written material and guidebook.  Patient learns about the Pritikin Eating Plan and its benefit of reducing the risk of disease. The Pritikin Eating Plan does not focus on calories. Instead, it emphasizes high-quality, nutrient-rich foods. By  knowing the characteristics of the foods, we choose, we can determine their calorie density and make informed decisions.  Targeting Your Nutrition Priorities  Clinical staff conducted group or individual video education with verbal and written material and guidebook.  Patient learns that lifestyle habits have a tremendous impact on disease risk and progression. This video provides eating and physical activity recommendations based on your personal health goals, such as reducing LDL cholesterol,  losing weight, preventing or controlling type 2 diabetes, and reducing high blood pressure.  Vitamins and Minerals  Clinical staff conducted group or individual video education with verbal and written material and guidebook.  Patient learns different ways to obtain key vitamins and minerals, including through a recommended healthy diet. It is important to discuss all supplements you take with your doctor.   Healthy Mind-Set    Smoking Cessation  Clinical staff conducted group or individual video education with verbal and written material and guidebook.  Patient learns that cigarette smoking and tobacco addiction pose a serious health risk which affects millions of people. Stopping smoking will significantly reduce the risk of heart disease, lung disease, and many forms of cancer. Recommended strategies for quitting are covered, including working with your doctor to develop a successful plan.  Culinary   Becoming a Set designer conducted group or individual video education with verbal and written material and guidebook.  Patient learns that cooking at home can be healthy, cost-effective, quick, and puts them in control. Keys to cooking healthy recipes will include looking at your recipe, assessing your equipment needs, planning ahead, making it simple, choosing cost-effective seasonal ingredients, and limiting the use of added fats, salts, and sugars.  Cooking - Breakfast and Snacks  Clinical staff conducted group or individual video education with verbal and written material and guidebook.  Patient learns how important breakfast is to satiety and nutrition through the entire day. Recommendations include key foods to eat during breakfast to help stabilize blood sugar levels and to prevent overeating at meals later in the day. Planning ahead is also a key component.  Cooking - Educational psychologist conducted group or individual video education with verbal and written  material and guidebook.  Patient learns eating strategies to improve overall health, including an approach to cook more at home. Recommendations include thinking of animal protein as a side on your plate rather than center stage and focusing instead on lower calorie dense options like vegetables, fruits, whole grains, and plant-based proteins, such as beans. Making sauces in large quantities to freeze for later and leaving the skin on your vegetables are also recommended to maximize your experience.  Cooking - Healthy Salads and Dressing Clinical staff conducted group or individual video education with verbal and written material and guidebook.  Patient learns that vegetables, fruits, whole grains, and legumes are the foundations of the Pritikin Eating Plan. Recommendations include how to incorporate each of these in flavorful and healthy salads, and how to create homemade salad dressings. Proper handling of ingredients is also covered. Cooking - Soups and [employer] - Soups and Desserts Clinical staff conducted group or individual video education with verbal and written material and guidebook.  Patient learns that Pritikin soups and desserts make for easy, nutritious, and delicious snacks and meal components that are low in sodium, fat, sugar, and calorie density, while high in vitamins, minerals, and filling fiber. Recommendations include simple and healthy ideas for soups and desserts.   Overview  The Pritikin Solution Program Overview Clinical staff conducted group or individual video education with verbal and written material and guidebook.  Patient learns that the results of the Pritikin Program have been documented in more than 100 articles published in peer-reviewed journals, and the benefits include reducing risk factors for (and, in some cases, even reversing) high cholesterol, high blood pressure, type 2 diabetes, obesity, and more! An overview of the three key pillars of the  Pritikin Program will be covered: eating well, doing regular exercise, and having a healthy mind-set.  WORKSHOPS  Exercise: Exercise Basics: Building Your Action Plan Clinical staff led group instruction and group discussion with PowerPoint presentation and patient guidebook. To enhance the learning environment the use of posters, models and videos may be added. At the conclusion of this workshop, patients will comprehend the difference between physical activity and exercise, as well as the benefits of incorporating both, into their routine. Patients will understand the FITT (Frequency, Intensity, Time, and Type) principle and how to use it to build an exercise action plan. In addition, safety concerns and other considerations for exercise and cardiac rehab will be addressed by the presenter. The purpose of this lesson is to promote a comprehensive and effective weekly exercise routine in order to improve patients' overall level of fitness.   Managing Heart Disease: Your Path to a Healthier Heart Clinical staff led group instruction and group discussion with PowerPoint presentation and patient guidebook. To enhance the learning environment the use of posters, models and videos may be added.At the conclusion of this workshop, patients will understand the anatomy and physiology of the heart. Additionally, they will understand how Pritikin's three pillars impact the risk factors, the progression, and the management of heart disease.  The purpose of this lesson is to provide a high-level overview of the heart, heart disease, and how the Pritikin lifestyle positively impacts risk factors.  Exercise Biomechanics Clinical staff led group instruction and group discussion with PowerPoint presentation and patient guidebook. To enhance the learning environment the use of posters, models and videos may be added. Patients will learn how the structural parts of their bodies function and how these functions  impact their daily activities, movement, and exercise. Patients will learn how to promote a neutral spine, learn how to manage pain, and identify ways to improve their physical movement in order to promote healthy living. The purpose of this lesson is to expose patients to common physical limitations that impact physical activity. Participants will learn practical ways to adapt and manage aches and pains, and to minimize their effect on regular exercise. Patients will learn how to maintain good posture while sitting, walking, and lifting.  Balance Training and Fall Prevention  Clinical staff led group instruction and group discussion with PowerPoint presentation and patient guidebook. To enhance the learning environment the use of posters, models and videos may be added. At the conclusion of this workshop, patients will understand the importance of their sensorimotor skills (vision, proprioception, and the vestibular system) in maintaining their ability to balance as they age. Patients will apply a variety of balancing exercises that are appropriate for their current level of function. Patients will understand the common causes for poor balance, possible solutions to these problems, and ways to modify their physical environment in order to minimize their fall risk. The purpose of this lesson is to teach patients about the importance of maintaining balance as they age and ways to minimize their risk of falling.  WORKSHOPS   Nutrition:  Fueling  a Healthy Body Clinical staff led group instruction and group discussion with PowerPoint presentation and patient guidebook. To enhance the learning environment the use of posters, models and videos may be added. Patients will review the foundational principles of the Pritikin Eating Plan and understand what constitutes a serving size in each of the food groups. Patients will also learn Pritikin-friendly foods that are better choices when away from home and  review make-ahead meal and snack options. Calorie density will be reviewed and applied to three nutrition priorities: weight maintenance, weight loss, and weight gain. The purpose of this lesson is to reinforce (in a group setting) the key concepts around what patients are recommended to eat and how to apply these guidelines when away from home by planning and selecting Pritikin-friendly options. Patients will understand how calorie density may be adjusted for different weight management goals.  Mindful Eating  Clinical staff led group instruction and group discussion with PowerPoint presentation and patient guidebook. To enhance the learning environment the use of posters, models and videos may be added. Patients will briefly review the concepts of the Pritikin Eating Plan and the importance of low-calorie dense foods. The concept of mindful eating will be introduced as well as the importance of paying attention to internal hunger signals. Triggers for non-hunger eating and techniques for dealing with triggers will be explored. The purpose of this lesson is to provide patients with the opportunity to review the basic principles of the Pritikin Eating Plan, discuss the value of eating mindfully and how to measure internal cues of hunger and fullness using the Hunger Scale. Patients will also discuss reasons for non-hunger eating and learn strategies to use for controlling emotional eating.  Targeting Your Nutrition Priorities Clinical staff led group instruction and group discussion with PowerPoint presentation and patient guidebook. To enhance the learning environment the use of posters, models and videos may be added. Patients will learn how to determine their genetic susceptibility to disease by reviewing their family history. Patients will gain insight into the importance of diet as part of an overall healthy lifestyle in mitigating the impact of genetics and other environmental insults. The purpose of  this lesson is to provide patients with the opportunity to assess their personal nutrition priorities by looking at their family history, their own health history and current risk factors. Patients will also be able to discuss ways of prioritizing and modifying the Pritikin Eating Plan for their highest risk areas  Menu  Clinical staff led group instruction and group discussion with PowerPoint presentation and patient guidebook. To enhance the learning environment the use of posters, models and videos may be added. Using menus brought in from [employer], or printed from [employer], patients will apply the Pritikin dining out guidelines that were presented in the [employer] video. Patients will also be able to practice these guidelines in a variety of provided scenarios. The purpose of this lesson is to provide patients with the opportunity to practice hands-on learning of the Pritikin Dining Out guidelines with actual menus and practice scenarios.  Label Reading Clinical staff led group instruction and group discussion with PowerPoint presentation and patient guidebook. To enhance the learning environment the use of posters, models and videos may be added. Patients will review and discuss the Pritikin label reading guidelines presented in Pritikin's Label Reading Educational series video. Using fool labels brought in from local grocery stores and markets, patients will apply the label reading guidelines and determine if the packaged food  meet the Pritikin guidelines. The purpose of this lesson is to provide patients with the opportunity to review, discuss, and practice hands-on learning of the Pritikin Label Reading guidelines with actual packaged food labels. Cooking School  Pritikin's [employer] are designed to teach patients ways to prepare quick, simple, and affordable recipes at home. The importance of nutrition's role in chronic disease risk reduction is  reflected in its emphasis in the overall Pritikin program. By learning how to prepare essential core Pritikin Eating Plan recipes, patients will increase control over what they eat; be able to customize the flavor of foods without the use of added salt, sugar, or fat; and improve the quality of the food they consume. By learning a set of core recipes which are easily assembled, quickly prepared, and affordable, patients are more likely to prepare more healthy foods at home. These workshops focus on convenient breakfasts, simple entres, side dishes, and desserts which can be prepared with minimal effort and are consistent with nutrition recommendations for cardiovascular risk reduction. Cooking [employer] are taught by a Armed forces logistics/support/administrative officer (RD) who has been trained by the [employer]. The chef or RD has a clear understanding of the importance of minimizing - if not completely eliminating - added fat, sugar, and sodium in recipes. Throughout the series of Cooking School Workshop sessions, patients will learn about healthy ingredients and efficient methods of cooking to build confidence in their capability to prepare    Cooking School weekly topics:  Adding Flavor- Sodium-Free  Fast and Healthy Breakfasts  Powerhouse Plant-Based Proteins  Satisfying Salads and Dressings  Simple Sides and Sauces  International Cuisine-Spotlight on the [employer] Zones  Delicious Desserts  Savory Soups  [employer] - Meals in a Astronomer Appetizers and Snacks  Comforting Weekend Breakfasts  One-Pot Wonders   Fast Evening Meals  Landscape architect Your Pritikin Plate  WORKSHOPS   Healthy Mindset (Psychosocial):  Focused Goals, Sustainable Changes Clinical staff led group instruction and group discussion with PowerPoint presentation and patient guidebook. To enhance the learning environment the use of posters, models and videos may be added. Patients will be able to  apply effective goal setting strategies to establish at least one personal goal, and then take consistent, meaningful action toward that goal. They will learn to identify common barriers to achieving personal goals and develop strategies to overcome them. Patients will also gain an understanding of how our mind-set can impact our ability to achieve goals and the importance of cultivating a positive and growth-oriented mind-set. The purpose of this lesson is to provide patients with a deeper understanding of how to set and achieve personal goals, as well as the tools and strategies needed to overcome common obstacles which may arise along the way.  From Head to Heart: The Power of a Healthy Outlook  Clinical staff led group instruction and group discussion with PowerPoint presentation and patient guidebook. To enhance the learning environment the use of posters, models and videos may be added. Patients will be able to recognize and describe the impact of emotions and mood on physical health. They will discover the importance of self-care and explore self-care practices which may work for them. Patients will also learn how to utilize the 4 C's to cultivate a healthier outlook and better manage stress and challenges. The purpose of this lesson is to demonstrate to patients how a healthy outlook is an essential part of maintaining good health, especially as they continue  their cardiac rehab journey.  Healthy Sleep for a Healthy Heart Clinical staff led group instruction and group discussion with PowerPoint presentation and patient guidebook. To enhance the learning environment the use of posters, models and videos may be added. At the conclusion of this workshop, patients will be able to demonstrate knowledge of the importance of sleep to overall health, well-being, and quality of life. They will understand the symptoms of, and treatments for, common sleep disorders. Patients will also be able to identify daytime  and nighttime behaviors which impact sleep, and they will be able to apply these tools to help manage sleep-related challenges. The purpose of this lesson is to provide patients with a general overview of sleep and outline the importance of quality sleep. Patients will learn about a few of the most common sleep disorders. Patients will also be introduced to the concept of "sleep hygiene," and discover ways to self-manage certain sleeping problems through simple daily behavior changes. Finally, the workshop will motivate patients by clarifying the links between quality sleep and their goals of heart-healthy living.   Recognizing and Reducing Stress Clinical staff led group instruction and group discussion with PowerPoint presentation and patient guidebook. To enhance the learning environment the use of posters, models and videos may be added. At the conclusion of this workshop, patients will be able to understand the types of stress reactions, differentiate between acute and chronic stress, and recognize the impact that chronic stress has on their health. They will also be able to apply different coping mechanisms, such as reframing negative self-talk. Patients will have the opportunity to practice a variety of stress management techniques, such as deep abdominal breathing, progressive muscle relaxation, and/or guided imagery.  The purpose of this lesson is to educate patients on the role of stress in their lives and to provide healthy techniques for coping with it.  Learning Barriers/Preferences:  Learning Barriers/Preferences - 08/02/23 1424       Learning Barriers/Preferences   Learning Barriers Sight    Learning Preferences Computer/Internet;Group Instruction;Pictoral;Written Material;Video;Verbal Instruction;Individual Instruction;Skilled Demonstration          Education Topics:  Knowledge Questionnaire Score:  Knowledge Questionnaire Score - 08/02/23 1425       Knowledge Questionnaire  Score   Pre Score 22/24          Core Components/Risk Factors/Patient Goals at Admission:  Personal Goals and Risk Factors at Admission - 08/02/23 1431       Core Components/Risk Factors/Patient Goals on Admission    Weight Management Yes    Intervention Weight Management: Develop a combined nutrition and exercise program designed to reach desired caloric intake, while maintaining appropriate intake of nutrient and fiber, sodium and fats, and appropriate energy expenditure required for the weight goal.;Weight Management: Provide education and appropriate resources to help participant work on and attain dietary goals.    Expected Outcomes Short Term: Continue to assess and modify interventions until short term weight is achieved;Long Term: Adherence to nutrition and physical activity/exercise program aimed toward attainment of established weight goal;Understanding recommendations for meals to include 15-35% energy as protein, 25-35% energy from fat, 35-60% energy from carbohydrates, less than 200mg  of dietary cholesterol, 20-35 gm of total fiber daily;Understanding of distribution of calorie intake throughout the day with the consumption of 4-5 meals/snacks    Hypertension Yes    Intervention Provide education on lifestyle modifcations including regular physical activity/exercise, weight management, moderate sodium restriction and increased consumption of fresh fruit, vegetables, and low fat dairy, alcohol moderation,  and smoking cessation.;Monitor prescription use compliance.    Expected Outcomes Short Term: Continued assessment and intervention until BP is < 140/1mm HG in hypertensive participants. < 130/33mm HG in hypertensive participants with diabetes, heart failure or chronic kidney disease.;Long Term: Maintenance of blood pressure at goal levels.    Lipids Yes    Intervention Provide education and support for participant on nutrition & aerobic/resistive exercise along with prescribed  medications to achieve LDL 70mg , HDL >40mg .    Expected Outcomes Short Term: Participant states understanding of desired cholesterol values and is compliant with medications prescribed. Participant is following exercise prescription and nutrition guidelines.;Long Term: Cholesterol controlled with medications as prescribed, with individualized exercise RX and with personalized nutrition plan. Value goals: LDL < 70mg , HDL > 40 mg.    Stress Yes    Intervention Offer individual and/or small group education and counseling on adjustment to heart disease, stress management and health-related lifestyle change. Teach and support self-help strategies.;Refer participants experiencing significant psychosocial distress to appropriate mental health specialists for further evaluation and treatment. When possible, include family members and significant others in education/counseling sessions.    Expected Outcomes Short Term: Participant demonstrates changes in health-related behavior, relaxation and other stress management skills, ability to obtain effective social support, and compliance with psychotropic medications if prescribed.;Long Term: Emotional wellbeing is indicated by absence of clinically significant psychosocial distress or social isolation.          Core Components/Risk Factors/Patient Goals Review:   Goals and Risk Factor Review     Row Name 08/19/23 0831 09/01/23 0931           Core Components/Risk Factors/Patient Goals Review   Personal Goals Review Weight Management/Obesity;Stress;Hypertension;Lipids Weight Management/Obesity;Stress;Hypertension;Lipids      Review Jesus [name] is off to a good start to exercise at cardiac rehab. Vital signs have been stable. Jesus [name] has bee increasing his work loads. Jesus [name] is doing well with exercise at cardiac rehab. Vital signs have been stable. Jesus [name] has been increasing his work loads.Jesus [name] is now jogging on the treadmill.      Expected Outcomes Jesus [name] will continue to  participate in cardiac rehab for exercise, nutrition and lifestyle modifications Jesus [name] will continue to participate in cardiac rehab for exercise, nutrition and lifestyle modifications         Core Components/Risk Factors/Patient Goals at Discharge (Final Review):   Goals and Risk Factor Review - 09/01/23 0931       Core Components/Risk Factors/Patient Goals Review   Personal Goals Review Weight Management/Obesity;Stress;Hypertension;Lipids    Review Jesus [name] is doing well with exercise at cardiac rehab. Vital signs have been stable. Jesus [name] has been increasing his work loads.Jesus [name] is now jogging on the treadmill.    Expected Outcomes Jesus [name] will continue to participate in cardiac rehab for exercise, nutrition and lifestyle modifications          ITP Comments:  ITP Comments     Row Name 08/02/23 1415 08/19/23 0814 09/01/23 0923       ITP Comments Dr. Gaylyn Keas medical director. Introduction to pritikin education/intensive cardiac rehab. Initial orientation packet reviewed with patient. 30 Day ITP Review. Jesus [name] started cardiac rehab on 08/11/23. Jesus [name] is off to a good start to exercise. 30 Day ITP Review. Jesus [name] has good attendance and participation with exercise at cardiac rehab        Comments: See ITP comments.[name] RN BSN

## 2023-09-01 NOTE — Telephone Encounter (Signed)
 Pharmacy Patient Advocate Encounter  Received notification from [employer] that Prior Authorization for Repatha has been APPROVED from 09/01/23 to 02/28/24. Ran test claim, Copay is $47.00- one month. This test claim was processed through [facility]- copay amounts may vary at other pharmacies due to pharmacy/plan contracts, or as the patient moves through the different stages of their insurance plan.   PA #/Case ID/Reference #: [id number]

## 2023-09-01 NOTE — Telephone Encounter (Signed)
 Pharmacy Patient Advocate Encounter   Received notification from Pt Calls Messages that prior authorization for Repatha is required/requested.   Insurance verification completed.   The patient is insured through [facility] [employer]/RX [employer] .   Per test claim: PA required; PA submitted to above mentioned insurance via CoverMyMeds Key/confirmation #/EOC  [id number] Status is pending

## 2023-09-01 NOTE — Telephone Encounter (Signed)
 Please do PA for Repatha

## 2023-09-01 NOTE — Progress Notes (Signed)
 QUALITY OF LIFE SCORE REVIEW  Pt completed Quality of Life survey as a participant in Cardiac Rehab.  Scores 21.0 or below are considered low.  Pt score very low in several areas Overall 18.71, Health and Function 17.46, socioeconomic 22.43, physiological and spiritual 18.43, family 16.75. Patient quality of life slightly altered by physical constraints which limits ability to perform as prior to recent cardiac illness.  Jesus [name] denies being depressed. Jesus [name] says his energy level has improved since participating in cardiac rehab. Jesus [name] also says he feels better since his losartan  has been discontinued  Offered emotional support and reassurance.  Will continue to monitor and intervene as necessary.[name] RN BSN

## 2023-09-02 MED ORDER — REPATHA SURECLICK 140 MG/ML ~~LOC~~ SOAJ: 140.0000 mg | SUBCUTANEOUS | 3 refills | Status: AC

## 2023-09-02 NOTE — Progress Notes (Signed)
 Quality of life questionnaire forwarded to patient's primary care provider Dr Camilo Cella per patient's request.Jacen Carlini Jomarie Neer RN BSN

## 2023-09-03 ENCOUNTER — Encounter (HOSPITAL_COMMUNITY): Admission: RE | Admit: 2023-09-03 | Source: Ambulatory Visit

## 2023-09-06 ENCOUNTER — Encounter (HOSPITAL_COMMUNITY)
Admission: RE | Admit: 2023-09-06 | Discharge: 2023-09-06 | Disposition: A | Discharge status: Home / Self Care | Source: Ambulatory Visit | Attending: Cardiology | Admitting: Cardiology

## 2023-09-06 DIAGNOSIS — Z955 Presence of coronary angioplasty implant and graft: Secondary | ICD-10-CM | POA: Diagnosis not present

## 2023-09-08 ENCOUNTER — Encounter (HOSPITAL_COMMUNITY)
Admission: RE | Admit: 2023-09-08 | Discharge: 2023-09-08 | Disposition: A | Discharge status: Home / Self Care | Source: Ambulatory Visit | Attending: Cardiology

## 2023-09-08 DIAGNOSIS — Z955 Presence of coronary angioplasty implant and graft: Secondary | ICD-10-CM

## 2023-09-09 ENCOUNTER — Encounter: Payer: Self-pay | Admitting: Pharmacist Clinician (PhC)/ Clinical Pharmacy Specialist

## 2023-09-10 ENCOUNTER — Encounter (HOSPITAL_COMMUNITY)
Admission: RE | Admit: 2023-09-10 | Discharge: 2023-09-10 | Disposition: A | Discharge status: Home / Self Care | Source: Ambulatory Visit | Attending: Cardiology | Admitting: Cardiology

## 2023-09-10 DIAGNOSIS — Z955 Presence of coronary angioplasty implant and graft: Secondary | ICD-10-CM | POA: Diagnosis not present

## 2023-09-13 ENCOUNTER — Encounter (HOSPITAL_COMMUNITY)
Admission: RE | Admit: 2023-09-13 | Discharge: 2023-09-13 | Disposition: A | Discharge status: Home / Self Care | Source: Ambulatory Visit | Attending: Cardiology | Admitting: Cardiology

## 2023-09-13 DIAGNOSIS — Z955 Presence of coronary angioplasty implant and graft: Secondary | ICD-10-CM | POA: Diagnosis not present

## 2023-09-15 ENCOUNTER — Encounter (HOSPITAL_COMMUNITY)
Admission: RE | Admit: 2023-09-15 | Discharge: 2023-09-15 | Disposition: A | Discharge status: Home / Self Care | Source: Ambulatory Visit | Attending: Cardiology | Admitting: Cardiology

## 2023-09-15 DIAGNOSIS — Z48812 Encounter for surgical aftercare following surgery on the circulatory system: Secondary | ICD-10-CM | POA: Insufficient documentation

## 2023-09-15 DIAGNOSIS — Z955 Presence of coronary angioplasty implant and graft: Secondary | ICD-10-CM | POA: Diagnosis not present

## 2023-09-20 ENCOUNTER — Encounter (HOSPITAL_COMMUNITY)
Admission: RE | Admit: 2023-09-20 | Discharge: 2023-09-20 | Disposition: A | Discharge status: Home / Self Care | Source: Ambulatory Visit | Attending: Cardiology | Admitting: Cardiology

## 2023-09-20 DIAGNOSIS — Z48812 Encounter for surgical aftercare following surgery on the circulatory system: Secondary | ICD-10-CM | POA: Diagnosis not present

## 2023-09-20 DIAGNOSIS — Z955 Presence of coronary angioplasty implant and graft: Secondary | ICD-10-CM

## 2023-09-22 ENCOUNTER — Encounter (HOSPITAL_COMMUNITY)
Admission: RE | Admit: 2023-09-22 | Discharge: 2023-09-22 | Disposition: A | Discharge status: Home / Self Care | Source: Ambulatory Visit | Attending: Cardiology | Admitting: Cardiology

## 2023-09-22 DIAGNOSIS — Z955 Presence of coronary angioplasty implant and graft: Secondary | ICD-10-CM

## 2023-09-22 DIAGNOSIS — Z48812 Encounter for surgical aftercare following surgery on the circulatory system: Secondary | ICD-10-CM | POA: Diagnosis not present

## 2023-09-23 ENCOUNTER — Encounter: Payer: Self-pay | Admitting: Cardiology

## 2023-09-24 ENCOUNTER — Encounter (HOSPITAL_COMMUNITY)

## 2023-09-24 ENCOUNTER — Encounter: Payer: Self-pay | Admitting: Neurology

## 2023-09-24 ENCOUNTER — Ambulatory Visit (INDEPENDENT_AMBULATORY_CARE_PROVIDER_SITE_OTHER): Admitting: Neurology

## 2023-09-24 VITALS — BP 118/75 | HR 58 | Ht 70.0 in | Wt 188.0 lb

## 2023-09-24 DIAGNOSIS — I6789 Other cerebrovascular disease: Secondary | ICD-10-CM

## 2023-09-24 NOTE — Patient Instructions (Signed)
 Good to see you. Continue working on control of vascular risk factors (cholesterol, glucose, blood pressure), continue Plavix . Continue regular exercise. Follow-up as needed, call for any changes.
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
## 2023-09-27 ENCOUNTER — Encounter (HOSPITAL_COMMUNITY)
Admission: RE | Admit: 2023-09-27 | Discharge: 2023-09-27 | Disposition: A | Discharge status: Home / Self Care | Source: Ambulatory Visit | Attending: Cardiology

## 2023-09-27 DIAGNOSIS — Z48812 Encounter for surgical aftercare following surgery on the circulatory system: Secondary | ICD-10-CM | POA: Diagnosis not present

## 2023-09-27 DIAGNOSIS — Z955 Presence of coronary angioplasty implant and graft: Secondary | ICD-10-CM

## 2023-09-28 NOTE — Progress Notes (Signed)
 Cardiac Individual Treatment Plan  Patient Details  Name: Jesus [name] MRN: [id number] Date of Birth: Aug 22, 1965 Referring Provider:   Flowsheet Row INTENSIVE CARDIAC REHAB ORIENT from 08/02/2023 in [facility]  Referring Provider Gordy Bergamo, MD    Initial Encounter Date:  Flowsheet Row INTENSIVE CARDIAC REHAB ORIENT from 08/02/2023 in [facility]  Date 08/02/23    Visit Diagnosis: 06/28/23 DES LCx  Patient's Home Medications on Admission:  Current Outpatient Medications:    ALPRAZolam (XANAX) 0.25 MG tablet, Take 0.25 mg by mouth at bedtime as needed for anxiety., Disp: , Rfl:    aspirin  (ASPIRIN  CHILDRENS) 81 MG chewable tablet, Chew 1 tablet (81 mg total) by mouth daily., Disp: , Rfl:    clopidogrel  (PLAVIX ) 75 MG tablet, Take 75 mg by mouth daily., Disp: , Rfl:    Evolocumab  (REPATHA  SURECLICK) 140 MG/ML SOAJ, Inject 140 mg into the skin every 14 (fourteen) days., Disp: 6 mL, Rfl: 3   ezetimibe  (ZETIA ) 10 MG tablet, Take 1 tablet (10 mg total) by mouth daily., Disp: 90 tablet, Rfl: 3   fluticasone (FLONASE) 50 MCG/ACT nasal spray, Place 2 sprays into both nostrils as needed for allergies or rhinitis., Disp: , Rfl:    loratadine (CLARITIN) 10 MG tablet, Take 10 mg by mouth daily., Disp: , Rfl:    meclizine  (ANTIVERT ) 25 MG tablet, Take 1 tablet (25 mg total) by mouth 3 (three) times daily as needed for dizziness., Disp: 20 tablet, Rfl: 0   Multiple Vitamin (MULTIVITAMIN) tablet, Take 1 tablet by mouth daily., Disp: , Rfl:    nitroGLYCERIN  (NITROSTAT ) 0.4 MG SL tablet, Place 1 tablet (0.4 mg total) under the tongue every 5 (five) minutes as needed., Disp: 25 tablet, Rfl: 2   propranolol (INDERAL) 20 MG tablet, Take 10 mg by mouth 2 (two) times daily., Disp: , Rfl:   Past Medical History: Past Medical History:  Diagnosis Date   Adenomatous colon polyp    Anxiety     Asthma    Depression    Drug-induced myopathy    Hearing loss    Hematuria    Hematuria    HLD (hyperlipidemia)    Hypercholesterolemia    Mild cognitive impairment    OSA (obstructive sleep apnea)     Tobacco Use: Social History   Tobacco Use  Smoking Status Never  Smokeless Tobacco Never    Labs: Review Flowsheet       Latest Ref Rng & Units 06/22/2023 07/01/2023  Labs for ITP Cardiac and Pulmonary Rehab  Cholestrol 100 - 199 mg/dL 882  -  LDL (calc) 0 - 99 mg/dL 59  -  HDL-C >60 mg/dL 46  -  Trlycerides 0 - 149 mg/dL 49  -  TCO2 22 - 32 mmol/L - 26     Capillary Blood Glucose: Lab Results  Component Value Date   GLUCAP 104 (H) 07/01/2023     Exercise Target Goals: Exercise Program Goal: Individual exercise prescription set using results from initial 6 min walk test and THRR while considering  patient's activity barriers and safety.   Exercise Prescription Goal: Initial exercise prescription builds to 30-45 minutes a day of aerobic activity, 2-3 days per week.  Home exercise guidelines will be given to patient during program as part of exercise prescription that the participant will acknowledge.  Activity Barriers & Risk Stratification:  Activity Barriers & Cardiac Risk Stratification -  08/02/23 1418       Activity Barriers & Cardiac Risk Stratification   Activity Barriers Other (comment)    Comments myalgias/cramps    Cardiac Risk Stratification High   <5 METs on         6 Minute Walk:  6 Minute Walk     Row Name 08/02/23 1539         6 Minute Walk   Phase Initial     Distance 1800 feet     Walk Time 6 minutes     # of Rest Breaks 0     MPH 3.41     METS 3.96     RPE 14     Perceived Dyspnea  0     VO2 Peak 13.87     Symptoms No     Resting HR 66 bpm     Resting BP 116/78     Resting Oxygen Saturation  100 %     Exercise Oxygen Saturation  during 6 min walk 100 %     Max Ex. HR 110 bpm     Max Ex. BP 156/84     2 Minute Post  BP 134/76        Oxygen Initial Assessment:   Oxygen Re-Evaluation:   Oxygen Discharge (Final Oxygen Re-Evaluation):   Initial Exercise Prescription:  Initial Exercise Prescription - 08/02/23 1500       Date of Initial Exercise RX and Referring Provider   Date 08/02/23    Referring Provider Gordy Bergamo, MD    Expected Discharge Date 10/27/23      Treadmill   MPH 3    Grade 0    Minutes 15    METs 2.8      Bike   Level 2    Watts 60    Minutes 15    METs 2.8      Prescription Details   Frequency (times per week) 3    Duration Progress to 30 minutes of continuous aerobic without signs/symptoms of physical distress      Intensity   THRR 40-80% of Max Heartrate 64-129    Ratings of Perceived Exertion 11-13    Perceived Dyspnea 0-4      Progression   Progression Continue progressive overload as per policy without signs/symptoms or physical distress.      Resistance Training   Training Prescription Yes    Weight 4    Reps 10-15          Perform Capillary Blood Glucose checks as needed.  Exercise Prescription Changes:   Exercise Prescription Changes     Row Name 08/11/23 1645 08/27/23 1657 09/15/23 1630 09/20/23 1400       Response to Exercise   Blood Pressure (Admit) 114/64 124/70 106/60 --    Blood Pressure (Exercise) 138/68 132/72 -- --    Blood Pressure (Exit) 114/68 108/68 104/64 --    Heart Rate (Admit) 75 bpm 77 bpm 61 bpm --    Heart Rate (Exercise) 126 bpm 90 bpm 126 bpm --    Heart Rate (Exit) 76 bpm 66 bpm 65 bpm --    Rating of Perceived Exertion (Exercise) 12 11 11  --    Perceived Dyspnea (Exercise) 0 0 0 --    Symptoms 0 0 0 --    Comments Pt first day in the [facility] program Reviewed MET's, goals and home ExRx REVD MET's --    Duration Progress to 30 minutes of  aerobic without  signs/symptoms of physical distress Progress to 30 minutes of  aerobic without signs/symptoms of physical distress Progress to 30 minutes of  aerobic without  signs/symptoms of physical distress --    Intensity THRR unchanged THRR unchanged THRR unchanged --      Progression   Progression Continue to progress workloads to maintain intensity without signs/symptoms of physical distress. Continue to progress workloads to maintain intensity without signs/symptoms of physical distress. Continue to progress workloads to maintain intensity without signs/symptoms of physical distress. --    Average METs 3.2 3.46 5.41 --      Resistance Training   Training Prescription No Yes Yes --    Weight 4 4 4  --    Reps 10-15 10-15 10-15 --    Time 0 Minutes 0 Minutes 10 Minutes --      Interval Training   Interval Training -- -- Yes --    Equipment -- -- Treadmill --    Comments -- -- 2 min walk, 1 min jog --      Treadmill   MPH 3 3 3   Interval 2 min walk, 1 min jog, jog 4.1/0 MET 7.28 --    Grade 0 1 3 --    Minutes 15 15 15  --    METs 3.3 3.71 4.53 --      Bike   Level 2 2.6  did not get MET's today, stats taken from last session 4 --    Watts 30 34 63 --    Minutes 71 68 15 --    METs 3.1 3.2 4.4 --      Home Exercise Plan   Plans to continue exercise at -- Home (comment) Home (comment) --    Frequency -- Add 3 additional days to program exercise sessions. Add 3 additional days to program exercise sessions. --    Initial Home Exercises Provided -- 08/27/23 08/27/23 --       Exercise Comments:   Exercise Comments     Row Name 08/11/23 1649 08/27/23 1705 09/15/23 1630 09/24/23 1649     Exercise Comments Pt first day in the [facility] Program. Pt tolerated exercise well with an average MET level of 3.2. Pt is off to a good start and is learning his THRR, RPE and ExRx. Will continue to monitor pt and progress workloads as toleratred without sign or symptom Reviewed MET's, goals and home ExRx. Pt tolerated exercise well with an average MET level of 3.46. Pt is feeling good with exercise and talked about progression today, he walks quite fast when  at home, so working on gradual increases on the TM here. He is interested in maybe interval training. will send in request. He's already walking on his own for exercise 2-3 days for 30 mins. He's happy with his progress and says his walks at home have become better, he's not as short of breath and his HR is lower. Reviewed MET's. Pt tolerated exercise well with an average MET level of 5.41. Pt feels good with his progress so far and is jogging in intervals. Patioent out of town today. WIll review education and goals soon       Exercise Goals and Review:   Exercise Goals     Row Name 08/02/23 1419             Exercise Goals   Increase Physical Activity Yes       Intervention Provide advice, education, support and counseling about physical activity/exercise needs.;Develop an individualized exercise prescription for  aerobic and resistive training based on initial evaluation findings, risk stratification, comorbidities and participant's personal goals.       Expected Outcomes Short Term: Attend rehab on a regular basis to increase amount of physical activity.;Long Term: Exercising regularly at least 3-5 days a week.;Long Term: Add in home exercise to make exercise part of routine and to increase amount of physical activity.       Increase Strength and Stamina Yes       Intervention Develop an individualized exercise prescription for aerobic and resistive training based on initial evaluation findings, risk stratification, comorbidities and participant's personal goals.;Provide advice, education, support and counseling about physical activity/exercise needs.       Expected Outcomes Short Term: Increase workloads from initial exercise prescription for resistance, speed, and METs.;Short Term: Perform resistance training exercises routinely during rehab and add in resistance training at home;Long Term: Improve cardiorespiratory fitness, muscular endurance and strength as measured by increased METs and  functional capacity ( )       Able to understand and use rate of perceived exertion (RPE) scale Yes       Intervention Provide education and explanation on how to use RPE scale       Expected Outcomes Long Term:  Able to use RPE to guide intensity level when exercising independently;Short Term: Able to use RPE daily in rehab to express subjective intensity level       Knowledge and understanding of Target Heart Rate Range (THRR) Yes       Intervention Provide education and explanation of THRR including how the numbers were predicted and where they are located for reference       Expected Outcomes Short Term: Able to state/look up THRR;Short Term: Able to use daily as guideline for intensity in rehab;Long Term: Able to use THRR to govern intensity when exercising independently       Understanding of Exercise Prescription Yes       Intervention Provide education, explanation, and written materials on patient's individual exercise prescription       Expected Outcomes Short Term: Able to explain program exercise prescription;Long Term: Able to explain home exercise prescription to exercise independently          Exercise Goals Re-Evaluation :  Exercise Goals Re-Evaluation     Row Name 08/11/23 1648 08/27/23 1701           Exercise Goal Re-Evaluation   Exercise Goals Review Increase Physical Activity;Understanding of Exercise Prescription;Increase Strength and Stamina;Knowledge and understanding of Target Heart Rate Range (THRR);Able to understand and use rate of perceived exertion (RPE) scale Increase Physical Activity;Understanding of Exercise Prescription;Increase Strength and Stamina;Knowledge and understanding of Target Heart Rate Range (THRR);Able to understand and use rate of perceived exertion (RPE) scale      Comments Pt first day in the [facility] Program. Pt tolerated exercise well with an average MET level of 3.2. Pt is off to a good start and is learning his THRR, RPE and ExRx.  Reviewed MET's, goals and home ExRx. Pt tolerated exercise well with an average MET level of 3.46. Pt is feeling good with exercise and talked about progression today, he walks quite fast when at home, so working on gradual increases on the TM here. He is interested in maybe interval training. will send in request. He's already walking on his own for exercise 2-3 days for 30 mins. He's happy with his progress and says his walks at home have become better, he's not as short of breath  and his HR is lower.      Expected Outcomes Will continue to monitor pt and progress workloads as toleratred without sign or symptom Will continue to monitor pt and progress workloads as toleratred without sign or symptom         Discharge Exercise Prescription (Final Exercise Prescription Changes):  Exercise Prescription Changes - 09/20/23 1400       Interval Training   Interval Training --    Equipment --    Comments --          Nutrition:  Target Goals: Understanding of nutrition guidelines, daily intake of sodium 1500mg , cholesterol 200mg , calories 30% from fat and 7% or less from saturated fats, daily to have 5 or more servings of fruits and vegetables.  Biometrics:    Nutrition Therapy Plan and Nutrition Goals:  Nutrition Therapy & Goals - 09/10/23 1156       Nutrition Therapy   Diet Heart Healthy Diet    Drug/Food Interactions Statins/Certain Fruits      Personal Nutrition Goals   Nutrition Goal Patient to identify strategies for reducing cardiovascular risk by attending the [facility] education and nutrition series weekly.   goal in action.   Personal Goal #2 Patient to improve diet quality by using the plate method as a guide for meal planning to include lean protein/plant protein, fruits, vegetables, whole grains, nonfat dairy as part of a well-balanced diet.   goal in action.   Comments Goals in action. Patient has medical history of  hypercholesterolemia, OSA on CPAP, stent placement. LDL  was well controlled on zetia , crestor , but has since stopped crestor  due to side effects and was started on repatha .  He is down 20.5# since hospitalization in March (05/25/23, 209#); he reports some fear of what foods to eat and many dietary changes contributing to weight loss. He continues to attend the [facility] education and nutrition series regularly and reports improved nutrition knowledge and improved variety of foods appropriate for CAD. He is down 1.5# since starting with our program. HIs wife is an excellent support. Patient will benefit from participation in intensive cardiac rehab for nutrition, exercise, and lifestyle modification.      Intervention Plan   Intervention Prescribe, educate and counsel regarding individualized specific dietary modifications aiming towards targeted core components such as weight, hypertension, lipid management, diabetes, heart failure and other comorbidities.;Nutrition handout(s) given to patient.    Expected Outcomes Short Term Goal: Understand basic principles of dietary content, such as calories, fat, sodium, cholesterol and nutrients.;Long Term Goal: Adherence to prescribed nutrition plan.          Nutrition Assessments:  Nutrition Assessments - 08/13/23 1639       Rate Your Plate Scores   Pre Score 90         MEDIFICTS Score Key: >=70 Need to make dietary changes  40-70 Heart Healthy Diet <= 40 Therapeutic Level Cholesterol Diet   Flowsheet Row INTENSIVE CARDIAC REHAB from 08/13/2023 in [facility]  Picture Your Plate Total Score on Admission 90   Picture Your Plate Scores: <59 Unhealthy dietary pattern with much room for improvement. 41-50 Dietary pattern unlikely to meet recommendations for good health and room for improvement. 51-60 More healthful dietary pattern, with some room for improvement.  >60 Healthy dietary pattern, although there may be some specific behaviors that  could be improved.    Nutrition Goals Re-Evaluation:  Nutrition Goals Re-Evaluation     Row  Name 08/12/23 1332 09/10/23 1156           Goals   Current Weight 188 lb 15 oz (85.7 kg) 186 lb 11.7 oz (84.7 kg)      Comment LDL 59, HD L46, LDL 59, HD L46,      Expected Outcome Patient has medical history of hypercholesterolemia, OSA on CPAP, stent placement. LDL is well controlled at this time (zetia , crestor ). He is down 20.5# since hospitalization in March (05/25/23, 209#); he reports some fear of what foods to eat and many dietary changes contributing to weight loss. Patient will benefit from participation in intensive cardiac rehab for nutrition, exercise, and lifestyle modification. Goals in action. Patient has medical history of hypercholesterolemia, OSA on CPAP, stent placement. LDL was well controlled on zetia , crestor , but has since stopped crestor  due to side effects and was started on repatha . He is down 20.5# since hospitalization in March (05/25/23, 209#); he reports some fear of what foods to eat and many dietary changes contributing to weight loss. He continues to attend the [facility] education and nutrition series regularly and reports improved nutrition knowledge and improved variety of foods appropriate for CAD. He is down 1.5# since starting with our program. HIs wife is an excellent support. Patient will benefit from participation in intensive cardiac rehab for nutrition, exercise, and lifestyle modification.         Nutrition Goals Re-Evaluation:  Nutrition Goals Re-Evaluation     Row Name 08/12/23 1332 09/10/23 1156           Goals   Current Weight 188 lb 15 oz (85.7 kg) 186 lb 11.7 oz (84.7 kg)      Comment LDL 59, HD L46, LDL 59, HD L46,      Expected Outcome Patient has medical history of hypercholesterolemia, OSA on CPAP, stent placement. LDL is well controlled at this time (zetia , crestor ). He is down 20.5# since hospitalization in March (05/25/23, 209#); he reports  some fear of what foods to eat and many dietary changes contributing to weight loss. Patient will benefit from participation in intensive cardiac rehab for nutrition, exercise, and lifestyle modification. Goals in action. Patient has medical history of hypercholesterolemia, OSA on CPAP, stent placement. LDL was well controlled on zetia , crestor , but has since stopped crestor  due to side effects and was started on repatha . He is down 20.5# since hospitalization in March (05/25/23, 209#); he reports some fear of what foods to eat and many dietary changes contributing to weight loss. He continues to attend the [facility] education and nutrition series regularly and reports improved nutrition knowledge and improved variety of foods appropriate for CAD. He is down 1.5# since starting with our program. HIs wife is an excellent support. Patient will benefit from participation in intensive cardiac rehab for nutrition, exercise, and lifestyle modification.         Nutrition Goals Discharge (Final Nutrition Goals Re-Evaluation):  Nutrition Goals Re-Evaluation - 09/10/23 1156       Goals   Current Weight 186 lb 11.7 oz (84.7 kg)    Comment LDL 59, HD L46,    Expected Outcome Goals in action. Patient has medical history of hypercholesterolemia, OSA on CPAP, stent placement. LDL was well controlled on zetia , crestor , but has since stopped crestor  due to side effects and was started on repatha . He is down 20.5# since hospitalization in March (05/25/23, 209#); he reports some fear of what foods to eat and many dietary changes contributing to weight loss. He continues to  attend the [facility] education and nutrition series regularly and reports improved nutrition knowledge and improved variety of foods appropriate for CAD. He is down 1.5# since starting with our program. HIs wife is an excellent support. Patient will benefit from participation in intensive cardiac rehab for nutrition, exercise, and lifestyle  modification.          Psychosocial: Target Goals: Acknowledge presence or absence of significant depression and/or stress, maximize coping skills, provide positive support system. Participant is able to verbalize types and ability to use techniques and skills needed for reducing stress and depression.  Initial Review & Psychosocial Screening:  Initial Psych Review & Screening - 08/02/23 1419       Initial Review   Current issues with Current Anxiety/Panic;Current Stress Concerns    Source of Stress Concerns Chronic Illness;Unable to participate in former interests or hobbies    Comments Jesus [name] shared that he has had some anxiety/stress since his stent placement. He has made a lot of changes to his diet and exercise since his stent placement, but figuring out what is the right thing for him to do has been a challenge for him an his wife and stressful at times. He is excited to be in the program and learn more. He also shared that communicating with other family and friends can be overwhelming when discussing health issues. He does not feel he needs additional resources at this time.      Family Dynamics   Good Support System? Yes   wife     Barriers   Psychosocial barriers to participate in program The patient should benefit from training in stress management and relaxation.      Screening Interventions   Interventions Provide feedback about the scores to participant;To provide support and resources with identified psychosocial needs;Encouraged to exercise    Expected Outcomes Long Term goal: The participant improves quality of Life and PHQ9 Scores as seen by post scores and/or verbalization of changes;Short Term goal: Identification and review with participant of any Quality of Life or Depression concerns found by scoring the questionnaire.;Long Term Goal: Stressors or current issues are controlled or eliminated.          Quality of Life Scores:  Quality of Life - 08/02/23 1540        Quality of Life   Select Quality of Life      Quality of Life Scores   Health/Function Pre 17.46 %    Socioeconomic Pre 22.43 %    Psych/Spiritual Pre 18.43 %    Family Pre 16.75 %    GLOBAL Pre 18.71 %         Scores of 19 and below usually indicate a poorer quality of life in these areas.  A difference of  2-3 points is a clinically meaningful difference.  A difference of 2-3 points in the total score of the Quality of Life Index has been associated with significant improvement in overall quality of life, self-image, physical symptoms, and general health in studies assessing change in quality of life.  PHQ-9: Review Flowsheet       08/02/2023  Depression screen PHQ 2/9  Decreased Interest 1  Down, Depressed, Hopeless 0  PHQ - 2 Score 1  Altered sleeping 0  Tired, decreased energy 2  Change in appetite 0  Feeling bad or failure about yourself  0  Trouble concentrating 1  Moving slowly or fidgety/restless 0  Suicidal thoughts 0  PHQ-9 Score 4  Difficult doing work/chores Somewhat difficult  Interpretation of Total Score  Total Score Depression Severity:  1-4 = Minimal depression, 5-9 = Mild depression, 10-14 = Moderate depression, 15-19 = Moderately severe depression, 20-27 = Severe depression   Psychosocial Evaluation and Intervention:   Psychosocial Re-Evaluation:  Psychosocial Re-Evaluation     Row Name 08/19/23 0817 09/01/23 0929 09/28/23 1723         Psychosocial Re-Evaluation   Current issues with Current Stress Concerns;Current Anxiety/Panic Current Stress Concerns;Current Anxiety/Panic Current Stress Concerns;Current Anxiety/Panic     Comments Jesus [name] has not voiced any increased concerns or stressors during exercise at cardiac rehab. Will review quality of life and PHQ9 in the upcoming week. Quality of life and PHQ9 reviewed.  Jesus [name] denies being depressed. Jesus [name] says his energy level has improved since participating in cardiac rehab. Jesus [name] also says he feels  better since his losartan  has been discontinued . Will forward to PCP Dr Teresa. Jesus [name] has not voiced any increased concerns or stressors during exercise at cardiac rehab.     Expected Outcomes Jesus [name] will have controlled or decreased stressors upon completion of exercise at cardiac rehab. Jesus [name] will have controlled or decreased stressors upon completion of exercise at cardiac rehab. Jesus [name] will have controlled or decreased stressors upon completion of exercise at cardiac rehab.     Interventions Stress management education;Encouraged to attend Cardiac Rehabilitation for the exercise;Relaxation education Stress management education;Encouraged to attend Cardiac Rehabilitation for the exercise;Relaxation education Stress management education;Encouraged to attend Cardiac Rehabilitation for the exercise;Relaxation education     Continue Psychosocial Services  Follow up required by staff Follow up required by staff No Follow up required       Initial Review   Source of Stress Concerns Chronic Illness Chronic Illness Chronic Illness     Comments Will continue to monitor and offer support as needed Will continue to monitor and offer support as needed Will continue to monitor and offer support as needed        Psychosocial Discharge (Final Psychosocial Re-Evaluation):  Psychosocial Re-Evaluation - 09/28/23 1723       Psychosocial Re-Evaluation   Current issues with Current Stress Concerns;Current Anxiety/Panic    Comments Jesus [name] has not voiced any increased concerns or stressors during exercise at cardiac rehab.    Expected Outcomes Jesus [name] will have controlled or decreased stressors upon completion of exercise at cardiac rehab.    Interventions Stress management education;Encouraged to attend Cardiac Rehabilitation for the exercise;Relaxation education    Continue Psychosocial Services  No Follow up required      Initial Review   Source of Stress Concerns Chronic Illness    Comments Will continue to monitor and  offer support as needed          Vocational Rehabilitation: Provide vocational rehab assistance to qualifying candidates.   Vocational Rehab Evaluation & Intervention:  Vocational Rehab - 08/02/23 1425       Initial Vocational Rehab Evaluation & Intervention   Assessment shows need for Vocational Rehabilitation No   Jesus [name] is not working, on disability.         Education: Education Goals: Education classes will be provided on a weekly basis, covering required topics. Participant will state understanding/return demonstration of topics presented.    Education     Row Name 08/11/23 1600     Education   Cardiac Education Topics Pritikin   Customer service manager   Weekly Topic [employer] Desserts   Instruction Review Code 1- [employer] Understanding  Class Start Time 1400   Class Stop Time 1437   Class Time Calculation (min) 37 min    Row Name 08/13/23 1600     Education   Cardiac Education Topics Pritikin   Nurse, children's   Educator Dietitian   Select Nutrition   Nutrition Calorie Density   Instruction Review Code 1- Verbalizes Understanding   Class Start Time 1400   Class Stop Time 1440   Class Time Calculation (min) 40 min    Row Name 08/16/23 1600     Education   Cardiac Education Topics Pritikin   Geographical information systems officer Exercise   Exercise Workshop Exercise Basics: Diplomatic Services operational officer   Instruction Review Code 1- Verbalizes Understanding   Class Start Time 1400   Class Stop Time 1450   Class Time Calculation (min) 50 min    Row Name 08/18/23 1400     Education   Cardiac Education Topics Pritikin   Customer service manager   Weekly Topic Efficiency Cooking - Meals in a Snap   Instruction Review Code 1- Verbalizes Understanding   Class Start Time 1400   Class Stop Time 1442   Class Time  Calculation (min) 42 min    Row Name 08/20/23 1400     Education   Cardiac Education Topics Pritikin   Psychologist, forensic Exercise Education   Exercise Education Move It!   Instruction Review Code 1- Verbalizes Understanding   Class Start Time 1402   Class Stop Time 1437   Class Time Calculation (min) 35 min    Row Name 08/23/23 1400     Education   Cardiac Education Topics Pritikin   Psychologist, forensic General Education   General Education Hypertension and Heart Disease   Instruction Review Code 1- Verbalizes Understanding   Class Start Time 1355   Class Stop Time 1443   Class Time Calculation (min) 48 min    Row Name 08/25/23 1500     Education   Cardiac Education Topics Pritikin   Customer service manager   Weekly Topic One-Pot Wonders   Instruction Review Code 1- Verbalizes Understanding   Class Start Time 1400   Class Stop Time 1445   Class Time Calculation (min) 45 min    Row Name 08/27/23 1500     Education   Cardiac Education Topics Pritikin   Glass blower/designer Nutrition   Nutrition Workshop Targeting Your Nutrition Priorities   Instruction Review Code 1- Verbalizes Understanding   Class Start Time 1400   Class Stop Time 1440   Class Time Calculation (min) 40 min    Row Name 08/30/23 1400     Education   Cardiac Education Topics Pritikin   Select Workshops     Workshops   Educator Exercise Physiologist   Select Psychosocial   Psychosocial Workshop Focused Goals, Sustainable Changes   Instruction Review Code 1- Verbalizes Understanding   Class Start Time 1400   Class Stop Time 1439   Class Time Calculation (min) 39 min    Row Name 09/01/23 1300  Education   Cardiac Education Topics Pritikin   Cabin crew   Weekly Topic Comforting Weekend Breakfasts   Instruction Review Code 1- Verbalizes Understanding   Class Start Time 1145   Class Stop Time 1220   Class Time Calculation (min) 35 min    Row Name 09/06/23 1400     Education   Cardiac Education Topics Pritikin   Select Core Videos     Core Videos   Educator Exercise Physiologist   Select Exercise Education   Exercise Education Biomechanial Limitations   Instruction Review Code 1- Verbalizes Understanding   Class Start Time 1415   Class Stop Time 1450   Class Time Calculation (min) 35 min    Row Name 09/08/23 1600     Education   Cardiac Education Topics Pritikin   Customer service manager   Weekly Topic Fast Evening Meals   Instruction Review Code 1- Verbalizes Understanding   Class Start Time 1400   Class Stop Time 1440   Class Time Calculation (min) 40 min    Row Name 09/10/23 1400     Education   Cardiac Education Topics Pritikin   Licensed conveyancer Nutrition   Nutrition Vitamins and Minerals   Instruction Review Code 1- Verbalizes Understanding   Class Start Time 1400   Class Stop Time 1440   Class Time Calculation (min) 40 min    Row Name 09/13/23 1400     Education   Cardiac Education Topics Pritikin   Glass blower/designer Nutrition   Nutrition Workshop Fueling a Forensic psychologist   Instruction Review Code 1- Tax inspector   Class Start Time 1400   Class Stop Time 1445   Class Time Calculation (min) 45 min    Row Name 09/15/23 1400     Education   Cardiac Education Topics Pritikin   Customer service manager   Weekly Topic International Cuisine- Spotlight on the [employer] Zones   Instruction Review Code 1- Verbalizes Understanding   Class Start Time 1355   Class Stop Time 1435   Class Time Calculation  (min) 40 min    Row Name 09/20/23 1500     Education   Cardiac Education Topics Pritikin   Geographical information systems officer Psychosocial   Psychosocial Workshop Healthy Sleep for a Healthy Heart   Instruction Review Code 1- Verbalizes Understanding   Class Start Time 1400   Class Stop Time 1440   Class Time Calculation (min) 40 min    Row Name 09/22/23 1500     Education   Cardiac Education Topics Pritikin   Customer service manager   Weekly Topic Simple Sides and Sauces   Instruction Review Code 1- Verbalizes Understanding   Class Start Time 1400   Class Stop Time 1445   Class Time Calculation (min) 45 min    Row Name 09/27/23 1400     Education   Cardiac Education Topics Pritikin   Publishing copy Education  Hypertension and Heart Disease   Instruction Review Code 1- Verbalizes Understanding   Class Start Time 1403   Class Stop Time 1445   Class Time Calculation (min) 42 min      Core Videos: Exercise    Move It!  Clinical staff conducted group or individual video education with verbal and written material and guidebook.  Patient learns the recommended Pritikin exercise program. Exercise with the goal of living a long, healthy life. Some of the health benefits of exercise include controlled diabetes, healthier blood pressure levels, improved cholesterol levels, improved heart and lung capacity, improved sleep, and better body composition. Everyone should speak with their doctor before starting or changing an exercise routine.  Biomechanical Limitations Clinical staff conducted group or individual video education with verbal and written material and guidebook.  Patient learns how biomechanical limitations can impact exercise and how we can mitigate and possibly overcome limitations to have an  impactful and balanced exercise routine.  Body Composition Clinical staff conducted group or individual video education with verbal and written material and guidebook.  Patient learns that body composition (ratio of muscle mass to fat mass) is a key component to assessing overall fitness, rather than body weight alone. Increased fat mass, especially visceral belly fat, can put us  at increased risk for metabolic syndrome, type 2 diabetes, heart disease, and even death. It is recommended to combine diet and exercise (cardiovascular and resistance training) to improve your body composition. Seek guidance from your physician and exercise physiologist before implementing an exercise routine.  Exercise Action Plan Clinical staff conducted group or individual video education with verbal and written material and guidebook.  Patient learns the recommended strategies to achieve and enjoy long-term exercise adherence, including variety, self-motivation, self-efficacy, and positive decision making. Benefits of exercise include fitness, good health, weight management, more energy, better sleep, less stress, and overall well-being.  Medical   Heart Disease Risk Reduction Clinical staff conducted group or individual video education with verbal and written material and guidebook.  Patient learns our heart is our most vital organ as it circulates oxygen, nutrients, white blood cells, and hormones throughout the entire body, and carries waste away. Data supports a plant-based eating plan like the Pritikin Program for its effectiveness in slowing progression of and reversing heart disease. The video provides a number of recommendations to address heart disease.   Metabolic Syndrome and Belly Fat  Clinical staff conducted group or individual video education with verbal and written material and guidebook.  Patient learns what metabolic syndrome is, how it leads to heart disease, and how one can reverse it and keep it  from coming back. You have metabolic syndrome if you have 3 of the following 5 criteria: abdominal obesity, high blood pressure, high triglycerides, low HDL cholesterol, and high blood sugar.  Hypertension and Heart Disease Clinical staff conducted group or individual video education with verbal and written material and guidebook.  Patient learns that high blood pressure, or hypertension, is very common in the [place] . Hypertension is largely due to excessive salt intake, but other important risk factors include being overweight, physical inactivity, drinking too much alcohol, smoking, and not eating enough potassium from fruits and vegetables. High blood pressure is a leading risk factor for heart attack, stroke, congestive heart failure, dementia, kidney failure, and premature death. Long-term effects of excessive salt intake include stiffening of the arteries and thickening of heart muscle and organ damage. Recommendations include ways to reduce hypertension and the risk of heart disease.  Diseases of Our Time - Focusing on Diabetes Clinical staff conducted group or individual video education with verbal and written material and guidebook.  Patient learns why the best way to stop diseases of our time is prevention, through food and other lifestyle changes. Medicine (such as prescription pills and surgeries) is often only a Band-Aid on the problem, not a long-term solution. Most common diseases of our time include obesity, type 2 diabetes, hypertension, heart disease, and cancer. The Pritikin Program is recommended and has been proven to help reduce, reverse, and/or prevent the damaging effects of metabolic syndrome.  Nutrition   Overview of the Pritikin Eating Plan  Clinical staff conducted group or individual video education with verbal and written material and guidebook.  Patient learns about the Pritikin Eating Plan for disease risk reduction. The Pritikin Eating Plan emphasizes a wide  variety of unrefined, minimally-processed carbohydrates, like fruits, vegetables, whole grains, and legumes. Go, Caution, and Stop food choices are explained. Plant-based and lean animal proteins are emphasized. Rationale provided for low sodium intake for blood pressure control, low added sugars for blood sugar stabilization, and low added fats and oils for coronary artery disease risk reduction and weight management.  Calorie Density  Clinical staff conducted group or individual video education with verbal and written material and guidebook.  Patient learns about calorie density and how it impacts the Pritikin Eating Plan. Knowing the characteristics of the food you choose will help you decide whether those foods will lead to weight gain or weight loss, and whether you want to consume more or less of them. Weight loss is usually a side effect of the Pritikin Eating Plan because of its focus on low calorie-dense foods.  Label Reading  Clinical staff conducted group or individual video education with verbal and written material and guidebook.  Patient learns about the Pritikin recommended label reading guidelines and corresponding recommendations regarding calorie density, added sugars, sodium content, and whole grains.  Dining Out - Part 1  Clinical staff conducted group or individual video education with verbal and written material and guidebook.  Patient learns that restaurant meals can be sabotaging because they can be so high in calories, fat, sodium, and/or sugar. Patient learns recommended strategies on how to positively address this and avoid unhealthy pitfalls.  Facts on Fats  Clinical staff conducted group or individual video education with verbal and written material and guidebook.  Patient learns that lifestyle modifications can be just as effective, if not more so, as many medications for lowering your risk of heart disease. A Pritikin lifestyle can help to reduce your risk of  inflammation and atherosclerosis (cholesterol build-up, or plaque, in the artery walls). Lifestyle interventions such as dietary choices and physical activity address the cause of atherosclerosis. A review of the types of fats and their impact on blood cholesterol levels, along with dietary recommendations to reduce fat intake is also included.  Nutrition Action Plan  Clinical staff conducted group or individual video education with verbal and written material and guidebook.  Patient learns how to incorporate Pritikin recommendations into their lifestyle. Recommendations include planning and keeping personal health goals in mind as an important part of their success.  Healthy Mind-Set    Healthy Minds, Bodies, Hearts  Clinical staff conducted group or individual video education with verbal and written material and guidebook.  Patient learns how to identify when they are stressed. Video will discuss the impact of that stress, as well as the many benefits of stress management. Patient will  also be introduced to stress management techniques. The way we think, act, and feel has an impact on our hearts.  How Our Thoughts Can Heal Our Hearts  Clinical staff conducted group or individual video education with verbal and written material and guidebook.  Patient learns that negative thoughts can cause depression and anxiety. This can result in negative lifestyle behavior and serious health problems. Cognitive behavioral therapy is an effective method to help control our thoughts in order to change and improve our emotional outlook.  Additional Videos:  Exercise    Improving Performance  Clinical staff conducted group or individual video education with verbal and written material and guidebook.  Patient learns to use a non-linear approach by alternating intensity levels and lengths of time spent exercising to help burn more calories and lose more body fat. Cardiovascular exercise helps improve heart health,  metabolism, hormonal balance, blood sugar control, and recovery from fatigue. Resistance training improves strength, endurance, balance, coordination, reaction time, metabolism, and muscle mass. Flexibility exercise improves circulation, posture, and balance. Seek guidance from your physician and exercise physiologist before implementing an exercise routine and learn your capabilities and proper form for all exercise.  Introduction to Yoga  Clinical staff conducted group or individual video education with verbal and written material and guidebook.  Patient learns about yoga, a discipline of the coming together of mind, breath, and body. The benefits of yoga include improved flexibility, improved range of motion, better posture and core strength, increased lung function, weight loss, and positive self-image. Yoga's heart health benefits include lowered blood pressure, healthier heart rate, decreased cholesterol and triglyceride levels, improved immune function, and reduced stress. Seek guidance from your physician and exercise physiologist before implementing an exercise routine and learn your capabilities and proper form for all exercise.  Medical   Aging: Enhancing Your Quality of Life  Clinical staff conducted group or individual video education with verbal and written material and guidebook.  Patient learns key strategies and recommendations to stay in good physical health and enhance quality of life, such as prevention strategies, having an advocate, securing a Health Care Proxy and Power of Attorney, and keeping a list of medications and system for tracking them. It also discusses how to avoid risk for bone loss.  Biology of Weight Control  Clinical staff conducted group or individual video education with verbal and written material and guidebook.  Patient learns that weight gain occurs because we consume more calories than we burn (eating more, moving less). Even if your body weight is normal, you  may have higher ratios of fat compared to muscle mass. Too much body fat puts you at increased risk for cardiovascular disease, heart attack, stroke, type 2 diabetes, and obesity-related cancers. In addition to exercise, following the Pritikin Eating Plan can help reduce your risk.  Decoding Lab Results  Clinical staff conducted group or individual video education with verbal and written material and guidebook.  Patient learns that lab test reflects one measurement whose values change over time and are influenced by many factors, including medication, stress, sleep, exercise, food, hydration, pre-existing medical conditions, and more. It is recommended to use the knowledge from this video to become more involved with your lab results and evaluate your numbers to speak with your doctor.   Diseases of Our Time - Overview  Clinical staff conducted group or individual video education with verbal and written material and guidebook.  Patient learns that according to the CDC, 50% to 70% of chronic diseases (such as  obesity, type 2 diabetes, elevated lipids, hypertension, and heart disease) are avoidable through lifestyle improvements including healthier food choices, listening to satiety cues, and increased physical activity.  Sleep Disorders Clinical staff conducted group or individual video education with verbal and written material and guidebook.  Patient learns how good quality and duration of sleep are important to overall health and well-being. Patient also learns about sleep disorders and how they impact health along with recommendations to address them, including discussing with a physician.  Nutrition  Dining Out - Part 2 Clinical staff conducted group or individual video education with verbal and written material and guidebook.  Patient learns how to plan ahead and communicate in order to maximize their dining experience in a healthy and nutritious manner. Included are recommended food choices  based on the type of restaurant the patient is visiting.   Fueling a Banker conducted group or individual video education with verbal and written material and guidebook.  There is a strong connection between our food choices and our health. Diseases like obesity and type 2 diabetes are very prevalent and are in large-part due to lifestyle choices. The Pritikin Eating Plan provides plenty of food and hunger-curbing satisfaction. It is easy to follow, affordable, and helps reduce health risks.  Menu Workshop  Clinical staff conducted group or individual video education with verbal and written material and guidebook.  Patient learns that restaurant meals can sabotage health goals because they are often packed with calories, fat, sodium, and sugar. Recommendations include strategies to plan ahead and to communicate with the manager, chef, or server to help order a healthier meal.  Planning Your Eating Strategy  Clinical staff conducted group or individual video education with verbal and written material and guidebook.  Patient learns about the Pritikin Eating Plan and its benefit of reducing the risk of disease. The Pritikin Eating Plan does not focus on calories. Instead, it emphasizes high-quality, nutrient-rich foods. By knowing the characteristics of the foods, we choose, we can determine their calorie density and make informed decisions.  Targeting Your Nutrition Priorities  Clinical staff conducted group or individual video education with verbal and written material and guidebook.  Patient learns that lifestyle habits have a tremendous impact on disease risk and progression. This video provides eating and physical activity recommendations based on your personal health goals, such as reducing LDL cholesterol, losing weight, preventing or controlling type 2 diabetes, and reducing high blood pressure.  Vitamins and Minerals  Clinical staff conducted group or individual video  education with verbal and written material and guidebook.  Patient learns different ways to obtain key vitamins and minerals, including through a recommended healthy diet. It is important to discuss all supplements you take with your doctor.   Healthy Mind-Set    Smoking Cessation  Clinical staff conducted group or individual video education with verbal and written material and guidebook.  Patient learns that cigarette smoking and tobacco addiction pose a serious health risk which affects millions of people. Stopping smoking will significantly reduce the risk of heart disease, lung disease, and many forms of cancer. Recommended strategies for quitting are covered, including working with your doctor to develop a successful plan.  Culinary   Becoming a Set designer conducted group or individual video education with verbal and written material and guidebook.  Patient learns that cooking at home can be healthy, cost-effective, quick, and puts them in control. Keys to cooking healthy recipes will include looking at your recipe,  assessing your equipment needs, planning ahead, making it simple, choosing cost-effective seasonal ingredients, and limiting the use of added fats, salts, and sugars.  Cooking - Breakfast and Snacks  Clinical staff conducted group or individual video education with verbal and written material and guidebook.  Patient learns how important breakfast is to satiety and nutrition through the entire day. Recommendations include key foods to eat during breakfast to help stabilize blood sugar levels and to prevent overeating at meals later in the day. Planning ahead is also a key component.  Cooking - Educational psychologist conducted group or individual video education with verbal and written material and guidebook.  Patient learns eating strategies to improve overall health, including an approach to cook more at home. Recommendations include thinking of  animal protein as a side on your plate rather than center stage and focusing instead on lower calorie dense options like vegetables, fruits, whole grains, and plant-based proteins, such as beans. Making sauces in large quantities to freeze for later and leaving the skin on your vegetables are also recommended to maximize your experience.  Cooking - Healthy Salads and Dressing Clinical staff conducted group or individual video education with verbal and written material and guidebook.  Patient learns that vegetables, fruits, whole grains, and legumes are the foundations of the Pritikin Eating Plan. Recommendations include how to incorporate each of these in flavorful and healthy salads, and how to create homemade salad dressings. Proper handling of ingredients is also covered. Cooking - Soups and [employer] - Soups and Desserts Clinical staff conducted group or individual video education with verbal and written material and guidebook.  Patient learns that Pritikin soups and desserts make for easy, nutritious, and delicious snacks and meal components that are low in sodium, fat, sugar, and calorie density, while high in vitamins, minerals, and filling fiber. Recommendations include simple and healthy ideas for soups and desserts.   Overview     The Pritikin Solution Program Overview Clinical staff conducted group or individual video education with verbal and written material and guidebook.  Patient learns that the results of the Pritikin Program have been documented in more than 100 articles published in peer-reviewed journals, and the benefits include reducing risk factors for (and, in some cases, even reversing) high cholesterol, high blood pressure, type 2 diabetes, obesity, and more! An overview of the three key pillars of the Pritikin Program will be covered: eating well, doing regular exercise, and having a healthy mind-set.  WORKSHOPS  Exercise: Exercise Basics: Building Your Action  Plan Clinical staff led group instruction and group discussion with PowerPoint presentation and patient guidebook. To enhance the learning environment the use of posters, models and videos may be added. At the conclusion of this workshop, patients will comprehend the difference between physical activity and exercise, as well as the benefits of incorporating both, into their routine. Patients will understand the FITT (Frequency, Intensity, Time, and Type) principle and how to use it to build an exercise action plan. In addition, safety concerns and other considerations for exercise and cardiac rehab will be addressed by the presenter. The purpose of this lesson is to promote a comprehensive and effective weekly exercise routine in order to improve patients' overall level of fitness.   Managing Heart Disease: Your Path to a Healthier Heart Clinical staff led group instruction and group discussion with PowerPoint presentation and patient guidebook. To enhance the learning environment the use of posters, models and videos may be added.At the conclusion of  this workshop, patients will understand the anatomy and physiology of the heart. Additionally, they will understand how Pritikin's three pillars impact the risk factors, the progression, and the management of heart disease.  The purpose of this lesson is to provide a high-level overview of the heart, heart disease, and how the Pritikin lifestyle positively impacts risk factors.  Exercise Biomechanics Clinical staff led group instruction and group discussion with PowerPoint presentation and patient guidebook. To enhance the learning environment the use of posters, models and videos may be added. Patients will learn how the structural parts of their bodies function and how these functions impact their daily activities, movement, and exercise. Patients will learn how to promote a neutral spine, learn how to manage pain, and identify ways to improve  their physical movement in order to promote healthy living. The purpose of this lesson is to expose patients to common physical limitations that impact physical activity. Participants will learn practical ways to adapt and manage aches and pains, and to minimize their effect on regular exercise. Patients will learn how to maintain good posture while sitting, walking, and lifting.  Balance Training and Fall Prevention  Clinical staff led group instruction and group discussion with PowerPoint presentation and patient guidebook. To enhance the learning environment the use of posters, models and videos may be added. At the conclusion of this workshop, patients will understand the importance of their sensorimotor skills (vision, proprioception, and the vestibular system) in maintaining their ability to balance as they age. Patients will apply a variety of balancing exercises that are appropriate for their current level of function. Patients will understand the common causes for poor balance, possible solutions to these problems, and ways to modify their physical environment in order to minimize their fall risk. The purpose of this lesson is to teach patients about the importance of maintaining balance as they age and ways to minimize their risk of falling.  WORKSHOPS   Nutrition:  Fueling a Ship broker led group instruction and group discussion with PowerPoint presentation and patient guidebook. To enhance the learning environment the use of posters, models and videos may be added. Patients will review the foundational principles of the Pritikin Eating Plan and understand what constitutes a serving size in each of the food groups. Patients will also learn Pritikin-friendly foods that are better choices when away from home and review make-ahead meal and snack options. Calorie density will be reviewed and applied to three nutrition priorities: weight maintenance, weight loss, and weight  gain. The purpose of this lesson is to reinforce (in a group setting) the key concepts around what patients are recommended to eat and how to apply these guidelines when away from home by planning and selecting Pritikin-friendly options. Patients will understand how calorie density may be adjusted for different weight management goals.  Mindful Eating  Clinical staff led group instruction and group discussion with PowerPoint presentation and patient guidebook. To enhance the learning environment the use of posters, models and videos may be added. Patients will briefly review the concepts of the Pritikin Eating Plan and the importance of low-calorie dense foods. The concept of mindful eating will be introduced as well as the importance of paying attention to internal hunger signals. Triggers for non-hunger eating and techniques for dealing with triggers will be explored. The purpose of this lesson is to provide patients with the opportunity to review the basic principles of the Pritikin Eating Plan, discuss the value of eating mindfully and how to measure internal  cues of hunger and fullness using the Hunger Scale. Patients will also discuss reasons for non-hunger eating and learn strategies to use for controlling emotional eating.  Targeting Your Nutrition Priorities Clinical staff led group instruction and group discussion with PowerPoint presentation and patient guidebook. To enhance the learning environment the use of posters, models and videos may be added. Patients will learn how to determine their genetic susceptibility to disease by reviewing their family history. Patients will gain insight into the importance of diet as part of an overall healthy lifestyle in mitigating the impact of genetics and other environmental insults. The purpose of this lesson is to provide patients with the opportunity to assess their personal nutrition priorities by looking at their family history, their own health history  and current risk factors. Patients will also be able to discuss ways of prioritizing and modifying the Pritikin Eating Plan for their highest risk areas  Menu  Clinical staff led group instruction and group discussion with PowerPoint presentation and patient guidebook. To enhance the learning environment the use of posters, models and videos may be added. Using menus brought in from [employer], or printed from [employer], patients will apply the Pritikin dining out guidelines that were presented in the [employer] video. Patients will also be able to practice these guidelines in a variety of provided scenarios. The purpose of this lesson is to provide patients with the opportunity to practice hands-on learning of the Pritikin Dining Out guidelines with actual menus and practice scenarios.  Label Reading Clinical staff led group instruction and group discussion with PowerPoint presentation and patient guidebook. To enhance the learning environment the use of posters, models and videos may be added. Patients will review and discuss the Pritikin label reading guidelines presented in Pritikin's Label Reading Educational series video. Using fool labels brought in from local grocery stores and markets, patients will apply the label reading guidelines and determine if the packaged food meet the Pritikin guidelines. The purpose of this lesson is to provide patients with the opportunity to review, discuss, and practice hands-on learning of the Pritikin Label Reading guidelines with actual packaged food labels. Cooking School  Pritikin's [employer] are designed to teach patients ways to prepare quick, simple, and affordable recipes at home. The importance of nutrition's role in chronic disease risk reduction is reflected in its emphasis in the overall Pritikin program. By learning how to prepare essential core Pritikin Eating Plan recipes, patients will increase control over  what they eat; be able to customize the flavor of foods without the use of added salt, sugar, or fat; and improve the quality of the food they consume. By learning a set of core recipes which are easily assembled, quickly prepared, and affordable, patients are more likely to prepare more healthy foods at home. These workshops focus on convenient breakfasts, simple entres, side dishes, and desserts which can be prepared with minimal effort and are consistent with nutrition recommendations for cardiovascular risk reduction. Cooking [employer] are taught by a Armed forces logistics/support/administrative officer (RD) who has been trained by the [employer]. The chef or RD has a clear understanding of the importance of minimizing - if not completely eliminating - added fat, sugar, and sodium in recipes. Throughout the series of Cooking School Workshop sessions, patients will learn about healthy ingredients and efficient methods of cooking to build confidence in their capability to prepare    Cooking School weekly topics:  Adding Flavor- Sodium-Free  Fast  and Healthy Breakfasts  Powerhouse Plant-Based Proteins  Satisfying Salads and Dressings  Simple Sides and Sauces  International Cuisine-Spotlight on the Blue Zones  Delicious Desserts  Savory Soups  Efficiency Cooking - Meals in a Snap  Tasty Appetizers and Snacks  Comforting Weekend Breakfasts  One-Pot Wonders   Fast Evening Meals  Landscape architect Your Pritikin Plate  WORKSHOPS   Healthy Mindset (Psychosocial):  Focused Goals, Sustainable Changes Clinical staff led group instruction and group discussion with PowerPoint presentation and patient guidebook. To enhance the learning environment the use of posters, models and videos may be added. Patients will be able to apply effective goal setting strategies to establish at least one personal goal, and then take consistent, meaningful action toward that goal. They will learn to  identify common barriers to achieving personal goals and develop strategies to overcome them. Patients will also gain an understanding of how our mind-set can impact our ability to achieve goals and the importance of cultivating a positive and growth-oriented mind-set. The purpose of this lesson is to provide patients with a deeper understanding of how to set and achieve personal goals, as well as the tools and strategies needed to overcome common obstacles which may arise along the way.  From Head to Heart: The Power of a Healthy Outlook  Clinical staff led group instruction and group discussion with PowerPoint presentation and patient guidebook. To enhance the learning environment the use of posters, models and videos may be added. Patients will be able to recognize and describe the impact of emotions and mood on physical health. They will discover the importance of self-care and explore self-care practices which may work for them. Patients will also learn how to utilize the 4 C's to cultivate a healthier outlook and better manage stress and challenges. The purpose of this lesson is to demonstrate to patients how a healthy outlook is an essential part of maintaining good health, especially as they continue their cardiac rehab journey.  Healthy Sleep for a Healthy Heart Clinical staff led group instruction and group discussion with PowerPoint presentation and patient guidebook. To enhance the learning environment the use of posters, models and videos may be added. At the conclusion of this workshop, patients will be able to demonstrate knowledge of the importance of sleep to overall health, well-being, and quality of life. They will understand the symptoms of, and treatments for, common sleep disorders. Patients will also be able to identify daytime and nighttime behaviors which impact sleep, and they will be able to apply these tools to help manage sleep-related challenges. The purpose of this lesson is to  provide patients with a general overview of sleep and outline the importance of quality sleep. Patients will learn about a few of the most common sleep disorders. Patients will also be introduced to the concept of "sleep hygiene," and discover ways to self-manage certain sleeping problems through simple daily behavior changes. Finally, the workshop will motivate patients by clarifying the links between quality sleep and their goals of heart-healthy living.   Recognizing and Reducing Stress Clinical staff led group instruction and group discussion with PowerPoint presentation and patient guidebook. To enhance the learning environment the use of posters, models and videos may be added. At the conclusion of this workshop, patients will be able to understand the types of stress reactions, differentiate between acute and chronic stress, and recognize the impact that chronic stress has on their health. They will also be able to apply different coping mechanisms, such as  reframing negative self-talk. Patients will have the opportunity to practice a variety of stress management techniques, such as deep abdominal breathing, progressive muscle relaxation, and/or guided imagery.  The purpose of this lesson is to educate patients on the role of stress in their lives and to provide healthy techniques for coping with it.  Learning Barriers/Preferences:  Learning Barriers/Preferences - 08/02/23 1424       Learning Barriers/Preferences   Learning Barriers Sight    Learning Preferences Computer/Internet;Group Instruction;Pictoral;Written Material;Video;Verbal Instruction;Individual Instruction;Skilled Demonstration          Education Topics:  Knowledge Questionnaire Score:  Knowledge Questionnaire Score - 08/02/23 1425       Knowledge Questionnaire Score   Pre Score 22/24          Core Components/Risk Factors/Patient Goals at Admission:  Personal Goals and Risk Factors at Admission - 08/02/23 1431        Core Components/Risk Factors/Patient Goals on Admission    Weight Management Yes    Intervention Weight Management: Develop a combined nutrition and exercise program designed to reach desired caloric intake, while maintaining appropriate intake of nutrient and fiber, sodium and fats, and appropriate energy expenditure required for the weight goal.;Weight Management: Provide education and appropriate resources to help participant work on and attain dietary goals.    Expected Outcomes Short Term: Continue to assess and modify interventions until short term weight is achieved;Long Term: Adherence to nutrition and physical activity/exercise program aimed toward attainment of established weight goal;Understanding recommendations for meals to include 15-35% energy as protein, 25-35% energy from fat, 35-60% energy from carbohydrates, less than 200mg  of dietary cholesterol, 20-35 gm of total fiber daily;Understanding of distribution of calorie intake throughout the day with the consumption of 4-5 meals/snacks    Hypertension Yes    Intervention Provide education on lifestyle modifcations including regular physical activity/exercise, weight management, moderate sodium restriction and increased consumption of fresh fruit, vegetables, and low fat dairy, alcohol moderation, and smoking cessation.;Monitor prescription use compliance.    Expected Outcomes Short Term: Continued assessment and intervention until BP is < 140/26mm HG in hypertensive participants. < 130/16mm HG in hypertensive participants with diabetes, heart failure or chronic kidney disease.;Long Term: Maintenance of blood pressure at goal levels.    Lipids Yes    Intervention Provide education and support for participant on nutrition & aerobic/resistive exercise along with prescribed medications to achieve LDL 70mg , HDL >40mg .    Expected Outcomes Short Term: Participant states understanding of desired cholesterol values and is compliant with  medications prescribed. Participant is following exercise prescription and nutrition guidelines.;Long Term: Cholesterol controlled with medications as prescribed, with individualized exercise RX and with personalized nutrition plan. Value goals: LDL < 70mg , HDL > 40 mg.    Stress Yes    Intervention Offer individual and/or small group education and counseling on adjustment to heart disease, stress management and health-related lifestyle change. Teach and support self-help strategies.;Refer participants experiencing significant psychosocial distress to appropriate mental health specialists for further evaluation and treatment. When possible, include family members and significant others in education/counseling sessions.    Expected Outcomes Short Term: Participant demonstrates changes in health-related behavior, relaxation and other stress management skills, ability to obtain effective social support, and compliance with psychotropic medications if prescribed.;Long Term: Emotional wellbeing is indicated by absence of clinically significant psychosocial distress or social isolation.          Core Components/Risk Factors/Patient Goals Review:   Goals and Risk Factor Review     Row Name  08/19/23 0831 09/01/23 0931 09/28/23 1725         Core Components/Risk Factors/Patient Goals Review   Personal Goals Review Weight Management/Obesity;Stress;Hypertension;Lipids Weight Management/Obesity;Stress;Hypertension;Lipids Weight Management/Obesity;Stress;Hypertension;Lipids     Review Jesus [name] is off to a good start to exercise at cardiac rehab. Vital signs have been stable. Jesus [name] has bee increasing his work loads. Jesus [name] is doing well with exercise at cardiac rehab. Vital signs have been stable. Jesus [name] has been increasing his work loads.Jesus [name] is now jogging on the treadmill. Jesus [name] continues to do very  well with exercise at cardiac rehab. Vital signs remain stable. Jesus [name] has been increasing his work loads and continues to  jog on the treadmill.     Expected Outcomes Jesus [name] will continue to participate in cardiac rehab for exercise, nutrition and lifestyle modifications Jesus [name] will continue to participate in cardiac rehab for exercise, nutrition and lifestyle modifications Jesus [name] will continue to participate in cardiac rehab for exercise, nutrition and lifestyle modifications        Core Components/Risk Factors/Patient Goals at Discharge (Final Review):   Goals and Risk Factor Review - 09/28/23 1725       Core Components/Risk Factors/Patient Goals Review   Personal Goals Review Weight Management/Obesity;Stress;Hypertension;Lipids    Review Jesus [name] continues to do very  well with exercise at cardiac rehab. Vital signs remain stable. Jesus [name] has been increasing his work loads and continues to jog on the treadmill.    Expected Outcomes Jesus [name] will continue to participate in cardiac rehab for exercise, nutrition and lifestyle modifications          ITP Comments:  ITP Comments     Row Name 08/02/23 1415 08/19/23 0814 09/01/23 0923 09/28/23 1723     ITP Comments Dr. Wilbert Bihari medical director. Introduction to pritikin education/intensive cardiac rehab. Initial orientation packet reviewed with patient. 30 Day ITP Review. Jesus [name] started cardiac rehab on 08/11/23. Jesus [name] is off to a good start to exercise. 30 Day ITP Review. Jesus [name] has good attendance and participation with exercise at cardiac rehab 30 Day ITP Review. Jesus [name] continues to have  good attendance and participation with exercise at cardiac rehab       Comments: See ITP comments.Hadassah Elpidio Quan RN BSN

## 2023-09-29 ENCOUNTER — Encounter (HOSPITAL_COMMUNITY)
Admission: RE | Admit: 2023-09-29 | Discharge: 2023-09-29 | Disposition: A | Discharge status: Home / Self Care | Source: Ambulatory Visit | Attending: Cardiology | Admitting: Cardiology

## 2023-09-29 DIAGNOSIS — Z48812 Encounter for surgical aftercare following surgery on the circulatory system: Secondary | ICD-10-CM | POA: Diagnosis not present

## 2023-09-29 DIAGNOSIS — Z955 Presence of coronary angioplasty implant and graft: Secondary | ICD-10-CM

## 2023-10-01 ENCOUNTER — Encounter (HOSPITAL_COMMUNITY)
Admission: RE | Admit: 2023-10-01 | Discharge: 2023-10-01 | Disposition: A | Discharge status: Home / Self Care | Source: Ambulatory Visit | Attending: Cardiology | Admitting: Cardiology

## 2023-10-01 DIAGNOSIS — Z955 Presence of coronary angioplasty implant and graft: Secondary | ICD-10-CM

## 2023-10-01 DIAGNOSIS — Z48812 Encounter for surgical aftercare following surgery on the circulatory system: Secondary | ICD-10-CM | POA: Diagnosis not present

## 2023-10-04 ENCOUNTER — Encounter (HOSPITAL_COMMUNITY)
Admission: RE | Admit: 2023-10-04 | Discharge: 2023-10-04 | Disposition: A | Discharge status: Home / Self Care | Source: Ambulatory Visit | Attending: Cardiology

## 2023-10-04 DIAGNOSIS — Z955 Presence of coronary angioplasty implant and graft: Secondary | ICD-10-CM

## 2023-10-04 DIAGNOSIS — Z48812 Encounter for surgical aftercare following surgery on the circulatory system: Secondary | ICD-10-CM | POA: Diagnosis not present

## 2023-10-06 ENCOUNTER — Encounter (HOSPITAL_COMMUNITY)
Admission: RE | Admit: 2023-10-06 | Discharge: 2023-10-06 | Disposition: A | Discharge status: Home / Self Care | Source: Ambulatory Visit | Attending: Cardiology

## 2023-10-06 DIAGNOSIS — Z955 Presence of coronary angioplasty implant and graft: Secondary | ICD-10-CM

## 2023-10-06 DIAGNOSIS — Z48812 Encounter for surgical aftercare following surgery on the circulatory system: Secondary | ICD-10-CM | POA: Diagnosis not present

## 2023-10-08 ENCOUNTER — Encounter (HOSPITAL_COMMUNITY)
Admission: RE | Admit: 2023-10-08 | Discharge: 2023-10-08 | Disposition: A | Discharge status: Home / Self Care | Source: Ambulatory Visit | Attending: Cardiology | Admitting: Cardiology

## 2023-10-08 DIAGNOSIS — Z955 Presence of coronary angioplasty implant and graft: Secondary | ICD-10-CM

## 2023-10-08 DIAGNOSIS — Z48812 Encounter for surgical aftercare following surgery on the circulatory system: Secondary | ICD-10-CM | POA: Diagnosis not present

## 2023-10-10 DIAGNOSIS — T148XXA Other injury of unspecified body region, initial encounter: Secondary | ICD-10-CM | POA: Diagnosis not present

## 2023-10-11 ENCOUNTER — Encounter (HOSPITAL_COMMUNITY)
Admission: RE | Admit: 2023-10-11 | Discharge: 2023-10-11 | Disposition: A | Discharge status: Home / Self Care | Source: Ambulatory Visit | Attending: Cardiology | Admitting: Cardiology

## 2023-10-11 DIAGNOSIS — Z955 Presence of coronary angioplasty implant and graft: Secondary | ICD-10-CM

## 2023-10-11 DIAGNOSIS — Z48812 Encounter for surgical aftercare following surgery on the circulatory system: Secondary | ICD-10-CM | POA: Diagnosis not present

## 2023-10-13 ENCOUNTER — Encounter (HOSPITAL_COMMUNITY)
Admission: RE | Admit: 2023-10-13 | Discharge: 2023-10-13 | Disposition: A | Discharge status: Home / Self Care | Source: Ambulatory Visit | Attending: Cardiology | Admitting: Cardiology

## 2023-10-13 DIAGNOSIS — Z48812 Encounter for surgical aftercare following surgery on the circulatory system: Secondary | ICD-10-CM | POA: Diagnosis not present

## 2023-10-13 DIAGNOSIS — Z955 Presence of coronary angioplasty implant and graft: Secondary | ICD-10-CM

## 2023-10-15 ENCOUNTER — Encounter (HOSPITAL_COMMUNITY): Admission: RE | Admit: 2023-10-15 | Source: Ambulatory Visit

## 2023-10-18 ENCOUNTER — Encounter (HOSPITAL_COMMUNITY)
Admission: RE | Admit: 2023-10-18 | Discharge: 2023-10-18 | Disposition: A | Discharge status: Home / Self Care | Source: Ambulatory Visit | Attending: Cardiology | Admitting: Cardiology

## 2023-10-18 DIAGNOSIS — Z48812 Encounter for surgical aftercare following surgery on the circulatory system: Secondary | ICD-10-CM | POA: Diagnosis not present

## 2023-10-18 DIAGNOSIS — Z955 Presence of coronary angioplasty implant and graft: Secondary | ICD-10-CM | POA: Insufficient documentation

## 2023-10-20 ENCOUNTER — Encounter (HOSPITAL_COMMUNITY)
Admission: RE | Admit: 2023-10-20 | Discharge: 2023-10-20 | Disposition: A | Discharge status: Home / Self Care | Source: Ambulatory Visit | Attending: Cardiology

## 2023-10-20 DIAGNOSIS — Z955 Presence of coronary angioplasty implant and graft: Secondary | ICD-10-CM | POA: Diagnosis not present

## 2023-10-22 ENCOUNTER — Encounter: Payer: Self-pay | Admitting: Pharmacist Clinician (PhC)/ Clinical Pharmacy Specialist

## 2023-10-22 ENCOUNTER — Encounter (HOSPITAL_COMMUNITY)
Admission: RE | Admit: 2023-10-22 | Discharge: 2023-10-22 | Disposition: A | Discharge status: Home / Self Care | Source: Ambulatory Visit | Attending: Cardiology | Admitting: Cardiology

## 2023-10-22 DIAGNOSIS — E785 Hyperlipidemia, unspecified: Secondary | ICD-10-CM

## 2023-10-22 DIAGNOSIS — Z955 Presence of coronary angioplasty implant and graft: Secondary | ICD-10-CM

## 2023-10-22 NOTE — Progress Notes (Signed)
 Cardiac Individual Treatment Plan  Patient Details  Name: MOHAMADOU MACIVER MRN: [id number] Date of Birth: Feb 14, 1966 Referring Provider:   Flowsheet Row INTENSIVE CARDIAC REHAB ORIENT from 08/02/2023 in [facility], & [facility]  Referring Provider Gordy Bergamo, MD    Initial Encounter Date:  Flowsheet Row INTENSIVE CARDIAC REHAB ORIENT from 08/02/2023 in [facility] for Heart, Vascular, & Lung Health  Date 08/02/23    Visit Diagnosis: 06/28/23 DES LCx  Patient's Home Medications on Admission:  Current Outpatient Medications:    ALPRAZolam (XANAX) 0.25 MG tablet, Take 0.25 mg by mouth at bedtime as needed for anxiety., Disp: , Rfl:    aspirin  (ASPIRIN  CHILDRENS) 81 MG chewable tablet, Chew 1 tablet (81 mg total) by mouth daily., Disp: , Rfl:    clopidogrel  (PLAVIX ) 75 MG tablet, Take 75 mg by mouth daily., Disp: , Rfl:    Evolocumab  (REPATHA  SURECLICK) 140 MG/ML SOAJ, Inject 140 mg into the skin every 14 (fourteen) days., Disp: 6 mL, Rfl: 3   ezetimibe  (ZETIA ) 10 MG tablet, Take 1 tablet (10 mg total) by mouth daily., Disp: 90 tablet, Rfl: 3   fluticasone (FLONASE) 50 MCG/ACT nasal spray, Place 2 sprays into both nostrils as needed for allergies or rhinitis., Disp: , Rfl:    loratadine (CLARITIN) 10 MG tablet, Take 10 mg by mouth daily., Disp: , Rfl:    meclizine  (ANTIVERT ) 25 MG tablet, Take 1 tablet (25 mg total) by mouth 3 (three) times daily as needed for dizziness., Disp: 20 tablet, Rfl: 0   Multiple Vitamin (MULTIVITAMIN) tablet, Take 1 tablet by mouth daily., Disp: , Rfl:    nitroGLYCERIN  (NITROSTAT ) 0.4 MG SL tablet, Place 1 tablet (0.4 mg total) under the tongue every 5 (five) minutes as needed., Disp: 25 tablet, Rfl: 2   propranolol (INDERAL) 20 MG tablet, Take 10 mg by mouth 2 (two) times daily., Disp: , Rfl:   Past Medical History: Past Medical History:  Diagnosis Date   Adenomatous colon polyp    Anxiety     Asthma    Depression    Drug-induced myopathy    Hearing loss    Hematuria    Hematuria    HLD (hyperlipidemia)    Hypercholesterolemia    Mild cognitive impairment    OSA (obstructive sleep apnea)     Tobacco Use: Social History   Tobacco Use  Smoking Status Never  Smokeless Tobacco Never    Labs: Review Flowsheet       Latest Ref Rng & Units 06/22/2023 07/01/2023  Labs for ITP Cardiac and Pulmonary Rehab  Cholestrol 100 - 199 mg/dL 882  -  LDL (calc) 0 - 99 mg/dL 59  -  HDL-C >60 mg/dL 46  -  Trlycerides 0 - 149 mg/dL 49  -  TCO2 22 - 32 mmol/L - 26     Capillary Blood Glucose: Lab Results  Component Value Date   GLUCAP 104 (H) 07/01/2023     Exercise Target Goals: Exercise Program Goal: Individual exercise prescription set using results from initial 6 min walk test and THRR while considering  patient's activity barriers and safety.   Exercise Prescription Goal: Initial exercise prescription builds to 30-45 minutes a day of aerobic activity, 2-3 days per week.  Home exercise guidelines will be given to patient during program as part of exercise prescription that the participant will acknowledge.  Activity Barriers & Risk Stratification:  Activity Barriers & Cardiac Risk Stratification -  08/02/23 1418       Activity Barriers & Cardiac Risk Stratification   Activity Barriers Other (comment)    Comments myalgias/cramps    Cardiac Risk Stratification High   <5 METs on         6 Minute Walk:  6 Minute Walk     Row Name 08/02/23 1539         6 Minute Walk   Phase Initial     Distance 1800 feet     Walk Time 6 minutes     # of Rest Breaks 0     MPH 3.41     METS 3.96     RPE 14     Perceived Dyspnea  0     VO2 Peak 13.87     Symptoms No     Resting HR 66 bpm     Resting BP 116/78     Resting Oxygen Saturation  100 %     Exercise Oxygen Saturation  during 6 min walk 100 %     Max Ex. HR 110 bpm     Max Ex. BP 156/84     2 Minute Post  BP 134/76        Oxygen Initial Assessment:   Oxygen Re-Evaluation:   Oxygen Discharge (Final Oxygen Re-Evaluation):   Initial Exercise Prescription:  Initial Exercise Prescription - 08/02/23 1500       Date of Initial Exercise RX and Referring Provider   Date 08/02/23    Referring Provider Gordy Bergamo, MD    Expected Discharge Date 10/27/23      Treadmill   MPH 3    Grade 0    Minutes 15    METs 2.8      Bike   Level 2    Watts 60    Minutes 15    METs 2.8      Prescription Details   Frequency (times per week) 3    Duration Progress to 30 minutes of continuous aerobic without signs/symptoms of physical distress      Intensity   THRR 40-80% of Max Heartrate 64-129    Ratings of Perceived Exertion 11-13    Perceived Dyspnea 0-4      Progression   Progression Continue progressive overload as per policy without signs/symptoms or physical distress.      Resistance Training   Training Prescription Yes    Weight 4    Reps 10-15          Perform Capillary Blood Glucose checks as needed.  Exercise Prescription Changes:   Exercise Prescription Changes     Row Name 08/11/23 1645 08/27/23 1657 09/15/23 1630 09/20/23 1400 10/08/23 1632     Response to Exercise   Blood Pressure (Admit) 114/64 124/70 106/60 -- 110/60   Blood Pressure (Exercise) 138/68 132/72 -- -- --   Blood Pressure (Exit) 114/68 108/68 104/64 -- 102/70   Heart Rate (Admit) 75 bpm 77 bpm 61 bpm -- 62 bpm   Heart Rate (Exercise) 126 bpm 90 bpm 126 bpm -- 141 bpm   Heart Rate (Exit) 76 bpm 66 bpm 65 bpm -- 77 bpm   Rating of Perceived Exertion (Exercise) 12 11 11  -- 12   Perceived Dyspnea (Exercise) 0 0 0 -- 0   Symptoms 0 0 0 -- 0   Comments Pt first day in the [employer] program Reviewed MET's, goals and home ExRx REVD MET's -- REVD MET's and goals   Duration Progress to  30 minutes of  aerobic without signs/symptoms of physical distress Progress to 30 minutes of  aerobic without  signs/symptoms of physical distress Progress to 30 minutes of  aerobic without signs/symptoms of physical distress -- Progress to 30 minutes of  aerobic without signs/symptoms of physical distress   Intensity THRR unchanged THRR unchanged THRR unchanged -- THRR unchanged     Progression   Progression Continue to progress workloads to maintain intensity without signs/symptoms of physical distress. Continue to progress workloads to maintain intensity without signs/symptoms of physical distress. Continue to progress workloads to maintain intensity without signs/symptoms of physical distress. -- Continue to progress workloads to maintain intensity without signs/symptoms of physical distress.   Average METs 3.2 3.46 5.41 -- 5.41     Resistance Training   Training Prescription No Yes Yes -- Yes   Weight 4 4 4  -- 4   Reps 10-15 10-15 10-15 -- 10-15   Time 0 Minutes 0 Minutes 10 Minutes -- 10 Minutes     Interval Training   Interval Training -- -- Yes -- Yes   Equipment -- -- Treadmill -- Treadmill   Comments -- -- 2 min walk, 1 min jog -- 5 min walk, 5 min jog     Treadmill   MPH 3 3 3   Interval 2 min walk, 1 min jog, jog 4.1/0 MET 7.28 -- 3  Interval 2 min walk, 1 min jog, jog 4.1/0 MET 7.28   Grade 0 1 3 -- 3   Minutes 15 15 15  -- 15   METs 3.3 3.71 4.53 -- 4.53     Bike   Level 2 2.6  did not get MET's today, stats taken from last session 4 -- 4   Watts 30 34 63 -- 66   Minutes 71 68 15 -- 15   METs 3.1 3.2 4.4 -- 4.4     Home Exercise Plan   Plans to continue exercise at -- Home (comment) Home (comment) -- Home (comment)   Frequency -- Add 3 additional days to program exercise sessions. Add 3 additional days to program exercise sessions. -- Add 3 additional days to program exercise sessions.   Initial Home Exercises Provided -- 08/27/23 08/27/23 -- 08/27/23    Row Name 10/22/23 1704             Response to Exercise   Blood Pressure (Admit) 120/68       Blood Pressure (Exit)  104/64       Heart Rate (Admit) 50 bpm       Heart Rate (Exercise) 127 bpm       Heart Rate (Exit) 59 bpm       Rating of Perceived Exertion (Exercise) 12       Perceived Dyspnea (Exercise) 0       Symptoms 0       Comments REVD MET's and goals       Intensity THRR New         Progression   Progression Continue to progress workloads to maintain intensity without signs/symptoms of physical distress.       Average METs 5.64         Resistance Training   Training Prescription Yes       Weight 4       Reps 10-15       Time 10 Minutes         Interval Training   Interval Training Yes       Equipment Treadmill  Comments 5 min walk, 5 min jog         Treadmill   MPH 3  Interval 2 min walk, 1 min jog, jog 4.1/0 MET 7.28       Grade 3       Minutes 15       METs 4.53         Bike   Level 4       Watts 85       Minutes 15       METs 5.1         Home Exercise Plan   Plans to continue exercise at Home (comment)       Frequency Add 3 additional days to program exercise sessions.       Initial Home Exercises Provided 08/27/23          Exercise Comments:   Exercise Comments     Row Name 08/11/23 1649 08/27/23 1705 09/15/23 1630 09/24/23 1649 10/08/23 1635   Exercise Comments Pt first day in the [facility] Program. Pt tolerated exercise well with an average MET level of 3.2. Pt is off to a good start and is learning his THRR, RPE and ExRx. Will continue to monitor pt and progress workloads as toleratred without sign or symptom Reviewed MET's, goals and home ExRx. Pt tolerated exercise well with an average MET level of 3.46. Pt is feeling good with exercise and talked about progression today, he walks quite fast when at home, so working on gradual increases on the TM here. He is interested in maybe interval training. will send in request. He's already walking on his own for exercise 2-3 days for 30 mins. He's happy with his progress and says his walks at home have become  better, he's not as short of breath and his HR is lower. Reviewed MET's. Pt tolerated exercise well with an average MET level of 5.41. Pt feels good with his progress so far and is jogging in intervals. Patioent out of town today. WIll review education and goals soon Reviewed MET's and goals. Pt tolerated exercise well with an average MET level of 5.41. Pt feels good with his progress he has now increase his intervals to 5 min jog, 5 min walk, 5 min jog. He did very well. He is increasing strength and stamina    Row Name 10/22/23 1707           Exercise Comments Reviewed MET's. Pt tolerated exercise well with an average MET level of 5.64. Pt feels good with his progress he has now increase his intervals to 5 min jog, 5 min walk, 5 min jog. He is also increasing MET's on the bike. Overall doing very well. Reviewed programs to join when he graduates next week.          Exercise Goals and Review:   Exercise Goals     Row Name 08/02/23 1419             Exercise Goals   Increase Physical Activity Yes       Intervention Provide advice, education, support and counseling about physical activity/exercise needs.;Develop an individualized exercise prescription for aerobic and resistive training based on initial evaluation findings, risk stratification, comorbidities and participant's personal goals.       Expected Outcomes Short Term: Attend rehab on a regular basis to increase amount of physical activity.;Long Term: Exercising regularly at least 3-5 days a week.;Long Term: Add in home exercise to make exercise part of routine  and to increase amount of physical activity.       Increase Strength and Stamina Yes       Intervention Develop an individualized exercise prescription for aerobic and resistive training based on initial evaluation findings, risk stratification, comorbidities and participant's personal goals.;Provide advice, education, support and counseling about physical activity/exercise  needs.       Expected Outcomes Short Term: Increase workloads from initial exercise prescription for resistance, speed, and METs.;Short Term: Perform resistance training exercises routinely during rehab and add in resistance training at home;Long Term: Improve cardiorespiratory fitness, muscular endurance and strength as measured by increased METs and functional capacity ( )       Able to understand and use rate of perceived exertion (RPE) scale Yes       Intervention Provide education and explanation on how to use RPE scale       Expected Outcomes Long Term:  Able to use RPE to guide intensity level when exercising independently;Short Term: Able to use RPE daily in rehab to express subjective intensity level       Knowledge and understanding of Target Heart Rate Range (THRR) Yes       Intervention Provide education and explanation of THRR including how the numbers were predicted and where they are located for reference       Expected Outcomes Short Term: Able to state/look up THRR;Short Term: Able to use daily as guideline for intensity in rehab;Long Term: Able to use THRR to govern intensity when exercising independently       Understanding of Exercise Prescription Yes       Intervention Provide education, explanation, and written materials on patient's individual exercise prescription       Expected Outcomes Short Term: Able to explain program exercise prescription;Long Term: Able to explain home exercise prescription to exercise independently          Exercise Goals Re-Evaluation :  Exercise Goals Re-Evaluation     Row Name 08/11/23 1648 08/27/23 1701 10/08/23 1637         Exercise Goal Re-Evaluation   Exercise Goals Review Increase Physical Activity;Understanding of Exercise Prescription;Increase Strength and Stamina;Knowledge and understanding of Target Heart Rate Range (THRR);Able to understand and use rate of perceived exertion (RPE) scale Increase Physical Activity;Understanding of  Exercise Prescription;Increase Strength and Stamina;Knowledge and understanding of Target Heart Rate Range (THRR);Able to understand and use rate of perceived exertion (RPE) scale Increase Physical Activity;Understanding of Exercise Prescription;Increase Strength and Stamina;Knowledge and understanding of Target Heart Rate Range (THRR);Able to understand and use rate of perceived exertion (RPE) scale     Comments Pt first day in the [facility] Program. Pt tolerated exercise well with an average MET level of 3.2. Pt is off to a good start and is learning his THRR, RPE and ExRx. Reviewed MET's, goals and home ExRx. Pt tolerated exercise well with an average MET level of 3.46. Pt is feeling good with exercise and talked about progression today, he walks quite fast when at home, so working on gradual increases on the TM here. He is interested in maybe interval training. will send in request. He's already walking on his own for exercise 2-3 days for 30 mins. He's happy with his progress and says his walks at home have become better, he's not as short of breath and his HR is lower. Reviewed MET's and goals. Pt tolerated exercise well with an average MET level of 5.41. Pt feels good with his progress he has now increase his intervals  to 5 min jog, 5 min walk, 5 min jog. He did very well. He is increasing strength and stamina     Expected Outcomes Will continue to monitor pt and progress workloads as toleratred without sign or symptom Will continue to monitor pt and progress workloads as toleratred without sign or symptom Will continue to monitor pt and progress workloads as toleratred without sign or symptom        Discharge Exercise Prescription (Final Exercise Prescription Changes):  Exercise Prescription Changes - 10/22/23 1704       Response to Exercise   Blood Pressure (Admit) 120/68    Blood Pressure (Exit) 104/64    Heart Rate (Admit) 50 bpm    Heart Rate (Exercise) 127 bpm    Heart Rate (Exit) 59  bpm    Rating of Perceived Exertion (Exercise) 12    Perceived Dyspnea (Exercise) 0    Symptoms 0    Comments REVD MET's and goals    Intensity THRR New      Progression   Progression Continue to progress workloads to maintain intensity without signs/symptoms of physical distress.    Average METs 5.64      Resistance Training   Training Prescription Yes    Weight 4    Reps 10-15    Time 10 Minutes      Interval Training   Interval Training Yes    Equipment Treadmill    Comments 5 min walk, 5 min jog      Treadmill   MPH 3   Interval 2 min walk, 1 min jog, jog 4.1/0 MET 7.28   Grade 3    Minutes 15    METs 4.53      Bike   Level 4    Watts 85    Minutes 15    METs 5.1      Home Exercise Plan   Plans to continue exercise at Home (comment)    Frequency Add 3 additional days to program exercise sessions.    Initial Home Exercises Provided 08/27/23          Nutrition:  Target Goals: Understanding of nutrition guidelines, daily intake of sodium 1500mg , cholesterol 200mg , calories 30% from fat and 7% or less from saturated fats, daily to have 5 or more servings of fruits and vegetables.  Biometrics:    Nutrition Therapy Plan and Nutrition Goals:  Nutrition Therapy & Goals - 10/11/23 1449       Nutrition Therapy   Diet Heart Healthy Diet    Drug/Food Interactions Statins/Certain Fruits      Personal Nutrition Goals   Nutrition Goal Patient to identify strategies for reducing cardiovascular risk by attending the [facility] education and nutrition series weekly.   goal in action.   Personal Goal #2 Patient to improve diet quality by using the plate method as a guide for meal planning to include lean protein/plant protein, fruits, vegetables, whole grains, nonfat dairy as part of a well-balanced diet.   goal in action.   Comments Goals in action. Patient has medical history of  hypercholesterolemia, OSA on CPAP, stent placement. LDL was well controlled on zetia ,  crestor , but has since stopped crestor  due to side effects and was started on repatha .  He is down 20.5# since hospitalization in March (05/25/23, 209#); he reports some fear of what foods to eat and many dietary changes contributing to weight loss. He continues to attend the [facility] education and nutrition series regularly and reports improved nutrition knowledge and  improved variety of foods appropriate for CAD. He has maintained his weight since starting with our program. HIs wife is an excellent support. Patient will benefit from participation in intensive cardiac rehab for nutrition, exercise, and lifestyle modification.      Intervention Plan   Intervention Prescribe, educate and counsel regarding individualized specific dietary modifications aiming towards targeted core components such as weight, hypertension, lipid management, diabetes, heart failure and other comorbidities.;Nutrition handout(s) given to patient.    Expected Outcomes Short Term Goal: Understand basic principles of dietary content, such as calories, fat, sodium, cholesterol and nutrients.;Long Term Goal: Adherence to prescribed nutrition plan.          Nutrition Assessments:  Nutrition Assessments - 08/13/23 1639       Rate Your Plate Scores   Pre Score 90         MEDIFICTS Score Key: >=70 Need to make dietary changes  40-70 Heart Healthy Diet <= 40 Therapeutic Level Cholesterol Diet   Flowsheet Row INTENSIVE CARDIAC REHAB from 08/13/2023 in [facility]  Picture Your Plate Total Score on Admission 90   Picture Your Plate Scores: <59 Unhealthy dietary pattern with much room for improvement. 41-50 Dietary pattern unlikely to meet recommendations for good health and room for improvement. 51-60 More healthful dietary pattern, with some room for improvement.  >60 Healthy dietary pattern, although there may be some specific behaviors that could be improved.     Nutrition Goals Re-Evaluation:  Nutrition Goals Re-Evaluation     Row Name 08/12/23 1332 09/10/23 1156 10/11/23 1449         Goals   Current Weight 188 lb 15 oz (85.7 kg) 186 lb 11.7 oz (84.7 kg) 187 lb 9.8 oz (85.1 kg)     Comment LDL 59, HD L46, LDL 59, HD L46, no new labs; most recent labs  LDL 59, HD L46     Expected Outcome Patient has medical history of hypercholesterolemia, OSA on CPAP, stent placement. LDL is well controlled at this time (zetia , crestor ). He is down 20.5# since hospitalization in March (05/25/23, 209#); he reports some fear of what foods to eat and many dietary changes contributing to weight loss. Patient will benefit from participation in intensive cardiac rehab for nutrition, exercise, and lifestyle modification. Goals in action. Patient has medical history of hypercholesterolemia, OSA on CPAP, stent placement. LDL was well controlled on zetia , crestor , but has since stopped crestor  due to side effects and was started on repatha . He is down 20.5# since hospitalization in March (05/25/23, 209#); he reports some fear of what foods to eat and many dietary changes contributing to weight loss. He continues to attend the [facility] education and nutrition series regularly and reports improved nutrition knowledge and improved variety of foods appropriate for CAD. He is down 1.5# since starting with our program. HIs wife is an excellent support. Patient will benefit from participation in intensive cardiac rehab for nutrition, exercise, and lifestyle modification. Goals in action. Patient has medical history of hypercholesterolemia, OSA on CPAP, stent placement. LDL was well controlled on zetia , crestor , but has since stopped crestor  due to side effects and was started on repatha . He is down 20.5# since hospitalization in March (05/25/23, 209#); he reports some fear of what foods to eat and many dietary changes contributing to weight loss. He continues to attend the [facility]  education and nutrition series regularly and reports improved nutrition knowledge and improved variety of foods appropriate for  CAD. He has maintained his weight since starting with our program. HIs wife is an excellent support. Patient will benefit from participation in intensive cardiac rehab for nutrition, exercise, and lifestyle modification.        Nutrition Goals Re-Evaluation:  Nutrition Goals Re-Evaluation     Row Name 08/12/23 1332 09/10/23 1156 10/11/23 1449         Goals   Current Weight 188 lb 15 oz (85.7 kg) 186 lb 11.7 oz (84.7 kg) 187 lb 9.8 oz (85.1 kg)     Comment LDL 59, HD L46, LDL 59, HD L46, no new labs; most recent labs  LDL 59, HD L46     Expected Outcome Patient has medical history of hypercholesterolemia, OSA on CPAP, stent placement. LDL is well controlled at this time (zetia , crestor ). He is down 20.5# since hospitalization in March (05/25/23, 209#); he reports some fear of what foods to eat and many dietary changes contributing to weight loss. Patient will benefit from participation in intensive cardiac rehab for nutrition, exercise, and lifestyle modification. Goals in action. Patient has medical history of hypercholesterolemia, OSA on CPAP, stent placement. LDL was well controlled on zetia , crestor , but has since stopped crestor  due to side effects and was started on repatha . He is down 20.5# since hospitalization in March (05/25/23, 209#); he reports some fear of what foods to eat and many dietary changes contributing to weight loss. He continues to attend the [facility] education and nutrition series regularly and reports improved nutrition knowledge and improved variety of foods appropriate for CAD. He is down 1.5# since starting with our program. HIs wife is an excellent support. Patient will benefit from participation in intensive cardiac rehab for nutrition, exercise, and lifestyle modification. Goals in action. Patient has medical history of hypercholesterolemia,  OSA on CPAP, stent placement. LDL was well controlled on zetia , crestor , but has since stopped crestor  due to side effects and was started on repatha . He is down 20.5# since hospitalization in March (05/25/23, 209#); he reports some fear of what foods to eat and many dietary changes contributing to weight loss. He continues to attend the [facility] education and nutrition series regularly and reports improved nutrition knowledge and improved variety of foods appropriate for CAD. He has maintained his weight since starting with our program. HIs wife is an excellent support. Patient will benefit from participation in intensive cardiac rehab for nutrition, exercise, and lifestyle modification.        Nutrition Goals Discharge (Final Nutrition Goals Re-Evaluation):  Nutrition Goals Re-Evaluation - 10/11/23 1449       Goals   Current Weight 187 lb 9.8 oz (85.1 kg)    Comment no new labs; most recent labs  LDL 59, HD L46    Expected Outcome Goals in action. Patient has medical history of hypercholesterolemia, OSA on CPAP, stent placement. LDL was well controlled on zetia , crestor , but has since stopped crestor  due to side effects and was started on repatha . He is down 20.5# since hospitalization in March (05/25/23, 209#); he reports some fear of what foods to eat and many dietary changes contributing to weight loss. He continues to attend the [facility] education and nutrition series regularly and reports improved nutrition knowledge and improved variety of foods appropriate for CAD. He has maintained his weight since starting with our program. HIs wife is an excellent support. Patient will benefit from participation in intensive cardiac rehab for nutrition, exercise, and lifestyle modification.          Psychosocial:  Target Goals: Acknowledge presence or absence of significant depression and/or stress, maximize coping skills, provide positive support system. Participant is able to verbalize types and  ability to use techniques and skills needed for reducing stress and depression.  Initial Review & Psychosocial Screening:  Initial Psych Review & Screening - 08/02/23 1419       Initial Review   Current issues with Current Anxiety/Panic;Current Stress Concerns    Source of Stress Concerns Chronic Illness;Unable to participate in former interests or hobbies    Comments Cathlyn shared that he has had some anxiety/stress since his stent placement. He has made a lot of changes to his diet and exercise since his stent placement, but figuring out what is the right thing for him to do has been a challenge for him an his wife and stressful at times. He is excited to be in the program and learn more. He also shared that communicating with other family and friends can be overwhelming when discussing health issues. He does not feel he needs additional resources at this time.      Family Dynamics   Good Support System? Yes   wife     Barriers   Psychosocial barriers to participate in program The patient should benefit from training in stress management and relaxation.      Screening Interventions   Interventions Provide feedback about the scores to participant;To provide support and resources with identified psychosocial needs;Encouraged to exercise    Expected Outcomes Long Term goal: The participant improves quality of Life and PHQ9 Scores as seen by post scores and/or verbalization of changes;Short Term goal: Identification and review with participant of any Quality of Life or Depression concerns found by scoring the questionnaire.;Long Term Goal: Stressors or current issues are controlled or eliminated.          Quality of Life Scores:  Quality of Life - 08/02/23 1540       Quality of Life   Select Quality of Life      Quality of Life Scores   Health/Function Pre 17.46 %    Socioeconomic Pre 22.43 %    Psych/Spiritual Pre 18.43 %    Family Pre 16.75 %    GLOBAL Pre 18.71 %          Scores of 19 and below usually indicate a poorer quality of life in these areas.  A difference of  2-3 points is a clinically meaningful difference.  A difference of 2-3 points in the total score of the Quality of Life Index has been associated with significant improvement in overall quality of life, self-image, physical symptoms, and general health in studies assessing change in quality of life.  PHQ-9: Review Flowsheet       08/02/2023  Depression screen PHQ 2/9  Decreased Interest 1  Down, Depressed, Hopeless 0  PHQ - 2 Score 1  Altered sleeping 0  Tired, decreased energy 2  Change in appetite 0  Feeling bad or failure about yourself  0  Trouble concentrating 1  Moving slowly or fidgety/restless 0  Suicidal thoughts 0  PHQ-9 Score 4  Difficult doing work/chores Somewhat difficult   Interpretation of Total Score  Total Score Depression Severity:  1-4 = Minimal depression, 5-9 = Mild depression, 10-14 = Moderate depression, 15-19 = Moderately severe depression, 20-27 = Severe depression   Psychosocial Evaluation and Intervention:   Psychosocial Re-Evaluation:  Psychosocial Re-Evaluation     Row Name 08/19/23 [phone] 09/01/23 9070 09/28/23 1723 10/22/23 9065  Psychosocial Re-Evaluation   Current issues with Current Stress Concerns;Current Anxiety/Panic Current Stress Concerns;Current Anxiety/Panic Current Stress Concerns;Current Anxiety/Panic Current Stress Concerns;Current Anxiety/Panic    Comments Cathlyn has not voiced any increased concerns or stressors during exercise at cardiac rehab. Will review quality of life and PHQ9 in the upcoming week. Quality of life and PHQ9 reviewed.  Cathlyn denies being depressed. Cathlyn says his energy level has improved since participating in cardiac rehab. Cathlyn also says he feels better since his losartan  has been discontinued . Will forward to PCP Dr Teresa. Cathlyn has not voiced any increased concerns or stressors during exercise at cardiac rehab.  Cathlyn continues not voice any increased concerns or stressors during exercise at cardiac rehab.    Expected Outcomes Cathlyn will have controlled or decreased stressors upon completion of exercise at cardiac rehab. Cathlyn will have controlled or decreased stressors upon completion of exercise at cardiac rehab. Cathlyn will have controlled or decreased stressors upon completion of exercise at cardiac rehab. Cathlyn will have controlled or decreased stressors upon completion of exercise at cardiac rehab.    Interventions Stress management education;Encouraged to attend Cardiac Rehabilitation for the exercise;Relaxation education Stress management education;Encouraged to attend Cardiac Rehabilitation for the exercise;Relaxation education Stress management education;Encouraged to attend Cardiac Rehabilitation for the exercise;Relaxation education Stress management education;Encouraged to attend Cardiac Rehabilitation for the exercise;Relaxation education    Continue Psychosocial Services  Follow up required by staff Follow up required by staff No Follow up required No Follow up required      Initial Review   Source of Stress Concerns Chronic Illness Chronic Illness Chronic Illness Chronic Illness    Comments Will continue to monitor and offer support as needed Will continue to monitor and offer support as needed Will continue to monitor and offer support as needed Will continue to monitor and offer support as needed       Psychosocial Discharge (Final Psychosocial Re-Evaluation):  Psychosocial Re-Evaluation - 10/22/23 0934       Psychosocial Re-Evaluation   Current issues with Current Stress Concerns;Current Anxiety/Panic    Comments Cathlyn continues not voice any increased concerns or stressors during exercise at cardiac rehab.    Expected Outcomes Cathlyn will have controlled or decreased stressors upon completion of exercise at cardiac rehab.    Interventions Stress management education;Encouraged to attend Cardiac  Rehabilitation for the exercise;Relaxation education    Continue Psychosocial Services  No Follow up required      Initial Review   Source of Stress Concerns Chronic Illness    Comments Will continue to monitor and offer support as needed          Vocational Rehabilitation: Provide vocational rehab assistance to qualifying candidates.   Vocational Rehab Evaluation & Intervention:  Vocational Rehab - 08/02/23 1425       Initial Vocational Rehab Evaluation & Intervention   Assessment shows need for Vocational Rehabilitation No   Cathlyn is not working, on disability.         Education: Education Goals: Education classes will be provided on a weekly basis, covering required topics. Participant will state understanding/return demonstration of topics presented.    Education     Row Name 08/11/23 1600     Education   Cardiac Education Topics [facility]   Customer service manager   Weekly Topic [employer] Desserts   Instruction Review Code 1- Verbalizes Understanding   Class Start Time 1400   Class Stop Time 1437  Class Time Calculation (min) 37 min    Row Name 08/13/23 1600     Education   Cardiac Education Topics [facility]in   Select Core Videos     Core Videos   Educator Dietitian   Select Nutrition   Nutrition Calorie Density   Instruction Review Code 1- Verbalizes Understanding   Class Start Time 1400   Class Stop Time 1440   Class Time Calculation (min) 40 min    Row Name 08/16/23 1600     Education   Cardiac Education Topics [facility]   Geographical information systems officer Exercise   Exercise Workshop Exercise Basics: Diplomatic Services operational officer   Instruction Review Code 1- Verbalizes Understanding   Class Start Time 1400   Class Stop Time 1450   Class Time Calculation (min) 50 min    Row Name 08/18/23 1400     Education   Cardiac Education Topics [facility]   Loss adjuster, chartered   Weekly Topic Efficiency Cooking - Meals in a Snap   Instruction Review Code 1- Verbalizes Understanding   Class Start Time 1400   Class Stop Time 1442   Class Time Calculation (min) 42 min    Row Name 08/20/23 1400     Education   Cardiac Education Topics [facility]   Psychologist, forensic Exercise Education   Exercise Education Move It!   Instruction Review Code 1- Verbalizes Understanding   Class Start Time 1402   Class Stop Time 1437   Class Time Calculation (min) 35 min    Row Name 08/23/23 1400     Education   Cardiac Education Topics [facility]   Psychologist, forensic General Education   General Education Hypertension and Heart Disease   Instruction Review Code 1- Verbalizes Understanding   Class Start Time 1355   Class Stop Time 1443   Class Time Calculation (min) 48 min    Row Name 08/25/23 1500     Education   Cardiac Education Topics [facility]in   Customer service manager   Weekly Topic One-Pot Wonders   Instruction Review Code 1- Verbalizes Understanding   Class Start Time 1400   Class Stop Time 1445   Class Time Calculation (min) 45 min    Row Name 08/27/23 1500     Education   Cardiac Education Topics [facility]   Glass blower/designer Nutrition   Nutrition Workshop Targeting Your Nutrition Priorities   Instruction Review Code 1- Verbalizes Understanding   Class Start Time 1400   Class Stop Time 1440   Class Time Calculation (min) 40 min    Row Name 08/30/23 1400     Education   Cardiac Education Topics [facility]   [employer]     Workshops   Educator Exercise Physiologist   Select Psychosocial   Psychosocial Workshop Focused Goals, Sustainable Changes   Instruction Review Code 1- Verbalizes  Understanding   Class Start Time 1400   Class Stop Time 1439   Class Time Calculation (min) 39 min    Row Name 09/01/23 1300     Education   Cardiac Education Topics [facility]  Customer service manager   Weekly Topic Comforting Weekend Breakfasts   Instruction Review Code 1- Verbalizes Understanding   Class Start Time 1145   Class Stop Time 1220   Class Time Calculation (min) 35 min    Row Name 09/06/23 1400     Education   Cardiac Education Topics [facility]   Psychologist, forensic Exercise Education   Exercise Education Biomechanial Limitations   Instruction Review Code 1- Verbalizes Understanding   Class Start Time 1415   Class Stop Time 1450   Class Time Calculation (min) 35 min    Row Name 09/08/23 1600     Education   Cardiac Education Topics [facility]in   Customer service manager   Weekly Topic Fast Evening Meals   Instruction Review Code 1- Verbalizes Understanding   Class Start Time 1400   Class Stop Time 1440   Class Time Calculation (min) 40 min    Row Name 09/10/23 1400     Education   Cardiac Education Topics [facility]   Licensed conveyancer Nutrition   Nutrition Vitamins and Minerals   Instruction Review Code 1- Verbalizes Understanding   Class Start Time 1400   Class Stop Time 1440   Class Time Calculation (min) 40 min    Row Name 09/13/23 1400     Education   Cardiac Education Topics [facility]   Glass blower/designer Nutrition   Nutrition Workshop Fueling a Forensic psychologist   Instruction Review Code 1- Tax inspector   Class Start Time 1400   Class Stop Time 1445   Class Time Calculation (min) 45 min    Row Name 09/15/23 1400     Education   Cardiac Education Topics [facility]   Museum/gallery exhibitions officer   Weekly Topic International Cuisine- Spotlight on the [employer] Zones   Instruction Review Code 1- Verbalizes Understanding   Class Start Time 1355   Class Stop Time 1435   Class Time Calculation (min) 40 min    Row Name 09/20/23 1500     Education   Cardiac Education Topics [facility]ritikin   Geographical information systems officer Psychosocial   Psychosocial Workshop Healthy Sleep for a Healthy Heart   Instruction Review Code 1- Verbalizes Understanding   Class Start Time 1400   Class Stop Time 1440   Class Time Calculation (min) 40 min    Row Name 09/22/23 1500     Education   Cardiac Education Topics P[facility]in   Customer service manager   Weekly Topic Simple Sides and Sauces   Instruction Review Code 1- Verbalizes Understanding   Class Start Time 1400   Class Stop Time 1445   Class Time Calculation (min) 45 min    Row Name 09/27/23 1400     Education   Cardiac Education Topics [facility]   Psychologist, forensic General Education   General Education Hypertension and Heart Disease   Instruction Review Code 1-  Verbalizes Understanding   Class Start Time 1403   Class Stop Time 1445   Class Time Calculation (min) 42 min    Row Name 09/29/23 1400     Education   Cardiac Education Topics [facility]in   Customer service manager   Weekly Topic Powerhouse Plant-Based Proteins   Instruction Review Code 1- Verbalizes Understanding   Class Start Time 1400   Class Stop Time 1440   Class Time Calculation (min) 40 min    Row Name 10/01/23 1500     Education   Cardiac Education Topics [facility]ritikin   Select Workshops     Workshops   Educator Nurse   Select Exercise   Exercise Workshop Managing Heart Disease: Your Path to a Healthier Heart   Instruction Review Code 1- Verbalizes Understanding    Class Start Time 1403   Class Stop Time 1453   Class Time Calculation (min) 50 min    Row Name 10/04/23 1400     Education   Cardiac Education Topics Pritikin   Geographical information systems officer Psychosocial   Psychosocial Workshop From Head to Heart: The Power of a Healthy Outlook   Instruction Review Code 1- Verbalizes Understanding   Class Start Time 1400   Class Stop Time 1445   Class Time Calculation (min) 45 min    Row Name 10/06/23 1400     Education   Cardiac Education Topics [facility]in   Customer service manager   Weekly Topic Tasty Appetizers and Snacks   Instruction Review Code 1- Verbalizes Understanding   Class Start Time 1400   Class Stop Time 1440   Class Time Calculation (min) 40 min    Row Name 10/08/23 1400     Education   Cardiac Education Topics [facility]   Hospital doctor Education   General Education Heart Disease Risk Reduction   Instruction Review Code 1- Verbalizes Understanding   Class Start Time 1403   Class Stop Time 1437   Class Time Calculation (min) 34 min    Row Name 10/11/23 1400     Education   Cardiac Education Topics [facility]in   Nurse, children's Exercise Physiologist   Select Psychosocial   Psychosocial Healthy Minds, Bodies, Hearts   Instruction Review Code 1- Verbalizes Understanding   Class Start Time 1415   Class Stop Time 1450   Class Time Calculation (min) 35 min    Row Name 10/13/23 1400     Education   Cardiac Education Topics Pritikin   Customer service manager   Weekly Topic Adding Flavor - Sodium-Free   Instruction Review Code 1- Verbalizes Understanding   Class Start Time 1345   Class Stop Time 1435   Class Time Calculation (min) 50 min    Row Name 10/18/23 1500     Education    Cardiac Education Topics [facility]in   Glass blower/designer Nutrition   Nutrition Workshop Label Reading   Instruction Review Code 1- Verbalizes Understanding   Class Start Time 1400   Class Stop Time 1450   Class Time Calculation (min) 50  min    Row Name 10/20/23 1500     Education   Cardiac Education Topics [facility]   Orthoptist   Educator Dietitian   Weekly Topic Fast and Healthy Breakfasts   Instruction Review Code 1- Verbalizes Understanding   Class Start Time 1400   Class Stop Time 1440   Class Time Calculation (min) 40 min    Row Name 10/22/23 1400     Education   Cardiac Education Topics Pritikin   Select Core Videos     Core Videos   Educator Dietitian   Select Nutrition   Nutrition Other   Instruction Review Code 1- Verbalizes Understanding   Class Start Time 1400   Class Stop Time 1445   Class Time Calculation (min) 45 min      Core Videos: Exercise    Move It!  Clinical staff conducted group or individual video education with verbal and written material and guidebook.  Patient learns the recommended [facility] exercise program. Exercise with the goal of living a long, healthy life. Some of the health benefits of exercise include controlled diabetes, healthier blood pressure levels, improved cholesterol levels, improved heart and lung capacity, improved sleep, and better body composition. Everyone should speak with their doctor before starting or changing an exercise routine.  Biomechanical Limitations Clinical staff conducted group or individual video education with verbal and written material and guidebook.  Patient learns how biomechanical limitations can impact exercise and how we can mitigate and possibly overcome limitations to have an impactful and balanced exercise routine.  Body Composition Clinical staff conducted group or individual video education with verbal and written  material and guidebook.  Patient learns that body composition (ratio of muscle mass to fat mass) is a key component to assessing overall fitness, rather than body weight alone. Increased fat mass, especially visceral belly fat, can put us  at increased risk for metabolic syndrome, type 2 diabetes, heart disease, and even death. It is recommended to combine diet and exercise (cardiovascular and resistance training) to improve your body composition. Seek guidance from your physician and exercise physiologist before implementing an exercise routine.  Exercise Action Plan Clinical staff conducted group or individual video education with verbal and written material and guidebook.  Patient learns the recommended strategies to achieve and enjoy long-term exercise adherence, including variety, self-motivation, self-efficacy, and positive decision making. Benefits of exercise include fitness, good health, weight management, more energy, better sleep, less stress, and overall well-being.  Medical   Heart Disease Risk Reduction Clinical staff conducted group or individual video education with verbal and written material and guidebook.  Patient learns our heart is our most vital organ as it circulates oxygen, nutrients, white blood cells, and hormones throughout the entire body, and carries waste away. Data supports a plant-based eating plan like the Pritikin Program for its effectiveness in slowing progression of and reversing heart disease. The video provides a number of recommendations to address heart disease.   Metabolic Syndrome and Belly Fat  Clinical staff conducted group or individual video education with verbal and written material and guidebook.  Patient learns what metabolic syndrome is, how it leads to heart disease, and how one can reverse it and keep it from coming back. You have metabolic syndrome if you have 3 of the following 5 criteria: abdominal obesity, high blood pressure, high triglycerides,  low HDL cholesterol, and high blood sugar.  Hypertension and Heart Disease Clinical staff conducted group or individual video education  with verbal and written material and guidebook.  Patient learns that high blood pressure, or hypertension, is very common in the [place] . Hypertension is largely due to excessive salt intake, but other important risk factors include being overweight, physical inactivity, drinking too much alcohol, smoking, and not eating enough potassium from fruits and vegetables. High blood pressure is a leading risk factor for heart attack, stroke, congestive heart failure, dementia, kidney failure, and premature death. Long-term effects of excessive salt intake include stiffening of the arteries and thickening of heart muscle and organ damage. Recommendations include ways to reduce hypertension and the risk of heart disease.  Diseases of Our Time - Focusing on Diabetes Clinical staff conducted group or individual video education with verbal and written material and guidebook.  Patient learns why the best way to stop diseases of our time is prevention, through food and other lifestyle changes. Medicine (such as prescription pills and surgeries) is often only a Band-Aid on the problem, not a long-term solution. Most common diseases of our time include obesity, type 2 diabetes, hypertension, heart disease, and cancer. The [facility]in Program is recommended and has been proven to help reduce, reverse, and/or prevent the damaging effects of metabolic syndrome.  Nutrition   Overview of the [facility]in Eating Plan  Clinical staff conducted group or individual video education with verbal and written material and guidebook.  Patient learns about the [facility]in Eating Plan for disease risk reduction. The [facility]in Eating Plan emphasizes a wide variety of unrefined, minimally-processed carbohydrates, like fruits, vegetables, whole grains, and legumes. Go, Caution, and Stop food choices are  explained. Plant-based and lean animal proteins are emphasized. Rationale provided for low sodium intake for blood pressure control, low added sugars for blood sugar stabilization, and low added fats and oils for coronary artery disease risk reduction and weight management.  Calorie Density  Clinical staff conducted group or individual video education with verbal and written material and guidebook.  Patient learns about calorie density and how it impacts the [facility]ikin Eating Plan. Knowing the characteristics of the food you choose will help you decide whether those foods will lead to weight gain or weight loss, and whether you want to consume more or less of them. Weight loss is usually a side effect of the Pritikin Eating Plan because of its focus on low calorie-dense foods.  Label Reading  Clinical staff conducted group or individual video education with verbal and written material and guidebook.  Patient learns about the Pritikin recommended label reading guidelines and corresponding recommendations regarding calorie density, added sugars, sodium content, and whole grains.  Dining Out - Part 1  Clinical staff conducted group or individual video education with verbal and written material and guidebook.  Patient learns that restaurant meals can be sabotaging because they can be so high in calories, fat, sodium, and/or sugar. Patient learns recommended strategies on how to positively address this and avoid unhealthy pitfalls.  Facts on Fats  Clinical staff conducted group or individual video education with verbal and written material and guidebook.  Patient learns that lifestyle modifications can be just as effective, if not more so, as many medications for lowering your risk of heart disease. A Pritikin lifestyle can help to reduce your risk of inflammation and atherosclerosis (cholesterol build-up, or plaque, in the artery walls). Lifestyle interventions such as dietary choices and physical activity  address the cause of atherosclerosis. A review of the types of fats and their impact on blood cholesterol levels, along with dietary recommendations to reduce  fat intake is also included.  Nutrition Action Plan  Clinical staff conducted group or individual video education with verbal and written material and guidebook.  Patient learns how to incorporate P[facility]ikin recommendations into their lifestyle. Recommendations include planning and keeping personal health goals in mind as an important part of their success.  Healthy Mind-Set    Healthy Minds, Bodies, Hearts  Clinical staff conducted group or individual video education with verbal and written material and guidebook.  Patient learns how to identify when they are stressed. Video will discuss the impact of that stress, as well as the many benefits of stress management. Patient will also be introduced to stress management techniques. The way we think, act, and feel has an impact on our hearts.  How Our Thoughts Can Heal Our Hearts  Clinical staff conducted group or individual video education with verbal and written material and guidebook.  Patient learns that negative thoughts can cause depression and anxiety. This can result in negative lifestyle behavior and serious health problems. Cognitive behavioral therapy is an effective method to help control our thoughts in order to change and improve our emotional outlook.  Additional Videos:  Exercise    Improving Performance  Clinical staff conducted group or individual video education with verbal and written material and guidebook.  Patient learns to use a non-linear approach by alternating intensity levels and lengths of time spent exercising to help burn more calories and lose more body fat. Cardiovascular exercise helps improve heart health, metabolism, hormonal balance, blood sugar control, and recovery from fatigue. Resistance training improves strength, endurance, balance, coordination,  reaction time, metabolism, and muscle mass. Flexibility exercise improves circulation, posture, and balance. Seek guidance from your physician and exercise physiologist before implementing an exercise routine and learn your capabilities and proper form for all exercise.  Introduction to Yoga  Clinical staff conducted group or individual video education with verbal and written material and guidebook.  Patient learns about yoga, a discipline of the coming together of mind, breath, and body. The benefits of yoga include improved flexibility, improved range of motion, better posture and core strength, increased lung function, weight loss, and positive self-image. Yoga's heart health benefits include lowered blood pressure, healthier heart rate, decreased cholesterol and triglyceride levels, improved immune function, and reduced stress. Seek guidance from your physician and exercise physiologist before implementing an exercise routine and learn your capabilities and proper form for all exercise.  Medical   Aging: Enhancing Your Quality of Life  Clinical staff conducted group or individual video education with verbal and written material and guidebook.  Patient learns key strategies and recommendations to stay in good physical health and enhance quality of life, such as prevention strategies, having an advocate, securing a Health Care Proxy and Power of Attorney, and keeping a list of medications and system for tracking them. It also discusses how to avoid risk for bone loss.  Biology of Weight Control  Clinical staff conducted group or individual video education with verbal and written material and guidebook.  Patient learns that weight gain occurs because we consume more calories than we burn (eating more, moving less). Even if your body weight is normal, you may have higher ratios of fat compared to muscle mass. Too much body fat puts you at increased risk for cardiovascular disease, heart attack, stroke,  type 2 diabetes, and obesity-related cancers. In addition to exercise, following the Pritikin Eating Plan can help reduce your risk.  Decoding Lab Results  Clinical staff conducted group or individual  video education with verbal and written material and guidebook.  Patient learns that lab test reflects one measurement whose values change over time and are influenced by many factors, including medication, stress, sleep, exercise, food, hydration, pre-existing medical conditions, and more. It is recommended to use the knowledge from this video to become more involved with your lab results and evaluate your numbers to speak with your doctor.   Diseases of Our Time - Overview  Clinical staff conducted group or individual video education with verbal and written material and guidebook.  Patient learns that according to the CDC, 50% to 70% of chronic diseases (such as obesity, type 2 diabetes, elevated lipids, hypertension, and heart disease) are avoidable through lifestyle improvements including healthier food choices, listening to satiety cues, and increased physical activity.  Sleep Disorders Clinical staff conducted group or individual video education with verbal and written material and guidebook.  Patient learns how good quality and duration of sleep are important to overall health and well-being. Patient also learns about sleep disorders and how they impact health along with recommendations to address them, including discussing with a physician.  Nutrition  Dining Out - Part 2 Clinical staff conducted group or individual video education with verbal and written material and guidebook.  Patient learns how to plan ahead and communicate in order to maximize their dining experience in a healthy and nutritious manner. Included are recommended food choices based on the type of restaurant the patient is visiting.   Fueling a Banker conducted group or individual video education with  verbal and written material and guidebook.  There is a strong connection between our food choices and our health. Diseases like obesity and type 2 diabetes are very prevalent and are in large-part due to lifestyle choices. The Pritikin Eating Plan provides plenty of food and hunger-curbing satisfaction. It is easy to follow, affordable, and helps reduce health risks.  Menu Workshop  Clinical staff conducted group or individual video education with verbal and written material and guidebook.  Patient learns that restaurant meals can sabotage health goals because they are often packed with calories, fat, sodium, and sugar. Recommendations include strategies to plan ahead and to communicate with the manager, chef, or server to help order a healthier meal.  Planning Your Eating Strategy  Clinical staff conducted group or individual video education with verbal and written material and guidebook.  Patient learns about the Pritikin Eating Plan and its benefit of reducing the risk of disease. The Pritikin Eating Plan does not focus on calories. Instead, it emphasizes high-quality, nutrient-rich foods. By knowing the characteristics of the foods, we choose, we can determine their calorie density and make informed decisions.  Targeting Your Nutrition Priorities  Clinical staff conducted group or individual video education with verbal and written material and guidebook.  Patient learns that lifestyle habits have a tremendous impact on disease risk and progression. This video provides eating and physical activity recommendations based on your personal health goals, such as reducing LDL cholesterol, losing weight, preventing or controlling type 2 diabetes, and reducing high blood pressure.  Vitamins and Minerals  Clinical staff conducted group or individual video education with verbal and written material and guidebook.  Patient learns different ways to obtain key vitamins and minerals, including through a  recommended healthy diet. It is important to discuss all supplements you take with your doctor.   Healthy Mind-Set    Smoking Cessation  Clinical staff conducted group or individual video education with verbal and  written material and guidebook.  Patient learns that cigarette smoking and tobacco addiction pose a serious health risk which affects millions of people. Stopping smoking will significantly reduce the risk of heart disease, lung disease, and many forms of cancer. Recommended strategies for quitting are covered, including working with your doctor to develop a successful plan.  Culinary   Becoming a Set designer conducted group or individual video education with verbal and written material and guidebook.  Patient learns that cooking at home can be healthy, cost-effective, quick, and puts them in control. Keys to cooking healthy recipes will include looking at your recipe, assessing your equipment needs, planning ahead, making it simple, choosing cost-effective seasonal ingredients, and limiting the use of added fats, salts, and sugars.  Cooking - Breakfast and Snacks  Clinical staff conducted group or individual video education with verbal and written material and guidebook.  Patient learns how important breakfast is to satiety and nutrition through the entire day. Recommendations include key foods to eat during breakfast to help stabilize blood sugar levels and to prevent overeating at meals later in the day. Planning ahead is also a key component.  Cooking - Educational psychologist conducted group or individual video education with verbal and written material and guidebook.  Patient learns eating strategies to improve overall health, including an approach to cook more at home. Recommendations include thinking of animal protein as a side on your plate rather than center stage and focusing instead on lower calorie dense options like vegetables, fruits, whole  grains, and plant-based proteins, such as beans. Making sauces in large quantities to freeze for later and leaving the skin on your vegetables are also recommended to maximize your experience.  Cooking - Healthy Salads and Dressing Clinical staff conducted group or individual video education with verbal and written material and guidebook.  Patient learns that vegetables, fruits, whole grains, and legumes are the foundations of the Pritikin Eating Plan. Recommendations include how to incorporate each of these in flavorful and healthy salads, and how to create homemade salad dressings. Proper handling of ingredients is also covered. Cooking - Soups and [employer] - Soups and Desserts Clinical staff conducted group or individual video education with verbal and written material and guidebook.  Patient learns that Pritikin soups and desserts make for easy, nutritious, and delicious snacks and meal components that are low in sodium, fat, sugar, and calorie density, while high in vitamins, minerals, and filling fiber. Recommendations include simple and healthy ideas for soups and desserts.   Overview     The Pritikin Solution Program Overview Clinical staff conducted group or individual video education with verbal and written material and guidebook.  Patient learns that the results of the Pritikin Program have been documented in more than 100 articles published in peer-reviewed journals, and the benefits include reducing risk factors for (and, in some cases, even reversing) high cholesterol, high blood pressure, type 2 diabetes, obesity, and more! An overview of the three key pillars of the Pritikin Program will be covered: eating well, doing regular exercise, and having a healthy mind-set.  WORKSHOPS  Exercise: Exercise Basics: Building Your Action Plan Clinical staff led group instruction and group discussion with PowerPoint presentation and patient guidebook. To enhance the learning environment  the use of posters, models and videos may be added. At the conclusion of this workshop, patients will comprehend the difference between physical activity and exercise, as well as the benefits of incorporating both, into their  routine. Patients will understand the FITT (Frequency, Intensity, Time, and Type) principle and how to use it to build an exercise action plan. In addition, safety concerns and other considerations for exercise and cardiac rehab will be addressed by the presenter. The purpose of this lesson is to promote a comprehensive and effective weekly exercise routine in order to improve patients' overall level of fitness.   Managing Heart Disease: Your Path to a Healthier Heart Clinical staff led group instruction and group discussion with PowerPoint presentation and patient guidebook. To enhance the learning environment the use of posters, models and videos may be added.At the conclusion of this workshop, patients will understand the anatomy and physiology of the heart. Additionally, they will understand how Pritikin's three pillars impact the risk factors, the progression, and the management of heart disease.  The purpose of this lesson is to provide a high-level overview of the heart, heart disease, and how the Pritikin lifestyle positively impacts risk factors.  Exercise Biomechanics Clinical staff led group instruction and group discussion with PowerPoint presentation and patient guidebook. To enhance the learning environment the use of posters, models and videos may be added. Patients will learn how the structural parts of their bodies function and how these functions impact their daily activities, movement, and exercise. Patients will learn how to promote a neutral spine, learn how to manage pain, and identify ways to improve their physical movement in order to promote healthy living. The purpose of this lesson is to expose patients to common physical limitations that impact  physical activity. Participants will learn practical ways to adapt and manage aches and pains, and to minimize their effect on regular exercise. Patients will learn how to maintain good posture while sitting, walking, and lifting.  Balance Training and Fall Prevention  Clinical staff led group instruction and group discussion with PowerPoint presentation and patient guidebook. To enhance the learning environment the use of posters, models and videos may be added. At the conclusion of this workshop, patients will understand the importance of their sensorimotor skills (vision, proprioception, and the vestibular system) in maintaining their ability to balance as they age. Patients will apply a variety of balancing exercises that are appropriate for their current level of function. Patients will understand the common causes for poor balance, possible solutions to these problems, and ways to modify their physical environment in order to minimize their fall risk. The purpose of this lesson is to teach patients about the importance of maintaining balance as they age and ways to minimize their risk of falling.  WORKSHOPS   Nutrition:  Fueling a Ship broker led group instruction and group discussion with PowerPoint presentation and patient guidebook. To enhance the learning environment the use of posters, models and videos may be added. Patients will review the foundational principles of the Pritikin Eating Plan and understand what constitutes a serving size in each of the food groups. Patients will also learn Pritikin-friendly foods that are better choices when away from home and review make-ahead meal and snack options. Calorie density will be reviewed and applied to three nutrition priorities: weight maintenance, weight loss, and weight gain. The purpose of this lesson is to reinforce (in a group setting) the key concepts around what patients are recommended to eat and how to apply these  guidelines when away from home by planning and selecting Pritikin-friendly options. Patients will understand how calorie density may be adjusted for different weight management goals.  Mindful Eating  Clinical staff led group instruction  and group discussion with PowerPoint presentation and patient guidebook. To enhance the learning environment the use of posters, models and videos may be added. Patients will briefly review the concepts of the Pritikin Eating Plan and the importance of low-calorie dense foods. The concept of mindful eating will be introduced as well as the importance of paying attention to internal hunger signals. Triggers for non-hunger eating and techniques for dealing with triggers will be explored. The purpose of this lesson is to provide patients with the opportunity to review the basic principles of the Pritikin Eating Plan, discuss the value of eating mindfully and how to measure internal cues of hunger and fullness using the Hunger Scale. Patients will also discuss reasons for non-hunger eating and learn strategies to use for controlling emotional eating.  Targeting Your Nutrition Priorities Clinical staff led group instruction and group discussion with PowerPoint presentation and patient guidebook. To enhance the learning environment the use of posters, models and videos may be added. Patients will learn how to determine their genetic susceptibility to disease by reviewing their family history. Patients will gain insight into the importance of diet as part of an overall healthy lifestyle in mitigating the impact of genetics and other environmental insults. The purpose of this lesson is to provide patients with the opportunity to assess their personal nutrition priorities by looking at their family history, their own health history and current risk factors. Patients will also be able to discuss ways of prioritizing and modifying the Pritikin Eating Plan for their highest risk  areas  Menu  Clinical staff led group instruction and group discussion with PowerPoint presentation and patient guidebook. To enhance the learning environment the use of posters, models and videos may be added. Using menus brought in from [employer], or printed from [employer], patients will apply the Pritikin dining out guidelines that were presented in the [employer] video. Patients will also be able to practice these guidelines in a variety of provided scenarios. The purpose of this lesson is to provide patients with the opportunity to practice hands-on learning of the Pritikin Dining Out guidelines with actual menus and practice scenarios.  Label Reading Clinical staff led group instruction and group discussion with PowerPoint presentation and patient guidebook. To enhance the learning environment the use of posters, models and videos may be added. Patients will review and discuss the Pritikin label reading guidelines presented in Pritikin's Label Reading Educational series video. Using fool labels brought in from local grocery stores and markets, patients will apply the label reading guidelines and determine if the packaged food meet the Pritikin guidelines. The purpose of this lesson is to provide patients with the opportunity to review, discuss, and practice hands-on learning of the Pritikin Label Reading guidelines with actual packaged food labels. Cooking School  Pritikin's [employer] are designed to teach patients ways to prepare quick, simple, and affordable recipes at home. The importance of nutrition's role in chronic disease risk reduction is reflected in its emphasis in the overall Pritikin program. By learning how to prepare essential core Pritikin Eating Plan recipes, patients will increase control over what they eat; be able to customize the flavor of foods without the use of added salt, sugar, or fat; and improve the quality of the food they  consume. By learning a set of core recipes which are easily assembled, quickly prepared, and affordable, patients are more likely to prepare more healthy foods at home. These workshops focus on convenient breakfasts, simple entres, side  dishes, and desserts which can be prepared with minimal effort and are consistent with nutrition recommendations for cardiovascular risk reduction. Cooking [employer] are taught by a Armed forces logistics/support/administrative officer (RD) who has been trained by the [employer]. The chef or RD has a clear understanding of the importance of minimizing - if not completely eliminating - added fat, sugar, and sodium in recipes. Throughout the series of Cooking School Workshop sessions, patients will learn about healthy ingredients and efficient methods of cooking to build confidence in their capability to prepare    Cooking School weekly topics:  Adding Flavor- Sodium-Free  Fast and Healthy Breakfasts  Powerhouse Plant-Based Proteins  Satisfying Salads and Dressings  Simple Sides and Sauces  International Cuisine-Spotlight on the [employer] Zones  Delicious Desserts  Savory Soups  [employer] - Meals in a Astronomer Appetizers and Snacks  Comforting Weekend Breakfasts  One-Pot Wonders   Fast Evening Meals  Landscape architect Your Pritikin Plate  WORKSHOPS   Healthy Mindset (Psychosocial):  Focused Goals, Sustainable Changes Clinical staff led group instruction and group discussion with PowerPoint presentation and patient guidebook. To enhance the learning environment the use of posters, models and videos may be added. Patients will be able to apply effective goal setting strategies to establish at least one personal goal, and then take consistent, meaningful action toward that goal. They will learn to identify common barriers to achieving personal goals and develop strategies to overcome them. Patients will also gain an understanding of how our  mind-set can impact our ability to achieve goals and the importance of cultivating a positive and growth-oriented mind-set. The purpose of this lesson is to provide patients with a deeper understanding of how to set and achieve personal goals, as well as the tools and strategies needed to overcome common obstacles which may arise along the way.  From Head to Heart: The Power of a Healthy Outlook  Clinical staff led group instruction and group discussion with PowerPoint presentation and patient guidebook. To enhance the learning environment the use of posters, models and videos may be added. Patients will be able to recognize and describe the impact of emotions and mood on physical health. They will discover the importance of self-care and explore self-care practices which may work for them. Patients will also learn how to utilize the 4 C's to cultivate a healthier outlook and better manage stress and challenges. The purpose of this lesson is to demonstrate to patients how a healthy outlook is an essential part of maintaining good health, especially as they continue their cardiac rehab journey.  Healthy Sleep for a Healthy Heart Clinical staff led group instruction and group discussion with PowerPoint presentation and patient guidebook. To enhance the learning environment the use of posters, models and videos may be added. At the conclusion of this workshop, patients will be able to demonstrate knowledge of the importance of sleep to overall health, well-being, and quality of life. They will understand the symptoms of, and treatments for, common sleep disorders. Patients will also be able to identify daytime and nighttime behaviors which impact sleep, and they will be able to apply these tools to help manage sleep-related challenges. The purpose of this lesson is to provide patients with a general overview of sleep and outline the importance of quality sleep. Patients will learn about a few of the most common  sleep disorders. Patients will also be introduced to the concept of "sleep hygiene," and discover ways to self-manage  certain sleeping problems through simple daily behavior changes. Finally, the workshop will motivate patients by clarifying the links between quality sleep and their goals of heart-healthy living.   Recognizing and Reducing Stress Clinical staff led group instruction and group discussion with PowerPoint presentation and patient guidebook. To enhance the learning environment the use of posters, models and videos may be added. At the conclusion of this workshop, patients will be able to understand the types of stress reactions, differentiate between acute and chronic stress, and recognize the impact that chronic stress has on their health. They will also be able to apply different coping mechanisms, such as reframing negative self-talk. Patients will have the opportunity to practice a variety of stress management techniques, such as deep abdominal breathing, progressive muscle relaxation, and/or guided imagery.  The purpose of this lesson is to educate patients on the role of stress in their lives and to provide healthy techniques for coping with it.  Learning Barriers/Preferences:  Learning Barriers/Preferences - 08/02/23 1424       Learning Barriers/Preferences   Learning Barriers Sight    Learning Preferences Computer/Internet;Group Instruction;Pictoral;Written Material;Video;Verbal Instruction;Individual Instruction;Skilled Demonstration          Education Topics:  Knowledge Questionnaire Score:  Knowledge Questionnaire Score - 08/02/23 1425       Knowledge Questionnaire Score   Pre Score 22/24          Core Components/Risk Factors/Patient Goals at Admission:  Personal Goals and Risk Factors at Admission - 08/02/23 1431       Core Components/Risk Factors/Patient Goals on Admission    Weight Management Yes    Intervention Weight Management: Develop a combined  nutrition and exercise program designed to reach desired caloric intake, while maintaining appropriate intake of nutrient and fiber, sodium and fats, and appropriate energy expenditure required for the weight goal.;Weight Management: Provide education and appropriate resources to help participant work on and attain dietary goals.    Expected Outcomes Short Term: Continue to assess and modify interventions until short term weight is achieved;Long Term: Adherence to nutrition and physical activity/exercise program aimed toward attainment of established weight goal;Understanding recommendations for meals to include 15-35% energy as protein, 25-35% energy from fat, 35-60% energy from carbohydrates, less than 200mg  of dietary cholesterol, 20-35 gm of total fiber daily;Understanding of distribution of calorie intake throughout the day with the consumption of 4-5 meals/snacks    Hypertension Yes    Intervention Provide education on lifestyle modifcations including regular physical activity/exercise, weight management, moderate sodium restriction and increased consumption of fresh fruit, vegetables, and low fat dairy, alcohol moderation, and smoking cessation.;Monitor prescription use compliance.    Expected Outcomes Short Term: Continued assessment and intervention until BP is < 140/60mm HG in hypertensive participants. < 130/101mm HG in hypertensive participants with diabetes, heart failure or chronic kidney disease.;Long Term: Maintenance of blood pressure at goal levels.    Lipids Yes    Intervention Provide education and support for participant on nutrition & aerobic/resistive exercise along with prescribed medications to achieve LDL 70mg , HDL >40mg .    Expected Outcomes Short Term: Participant states understanding of desired cholesterol values and is compliant with medications prescribed. Participant is following exercise prescription and nutrition guidelines.;Long Term: Cholesterol controlled with medications  as prescribed, with individualized exercise RX and with personalized nutrition plan. Value goals: LDL < 70mg , HDL > 40 mg.    Stress Yes    Intervention Offer individual and/or small group education and counseling on adjustment to heart disease, stress management and  health-related lifestyle change. Teach and support self-help strategies.;Refer participants experiencing significant psychosocial distress to appropriate mental health specialists for further evaluation and treatment. When possible, include family members and significant others in education/counseling sessions.    Expected Outcomes Short Term: Participant demonstrates changes in health-related behavior, relaxation and other stress management skills, ability to obtain effective social support, and compliance with psychotropic medications if prescribed.;Long Term: Emotional wellbeing is indicated by absence of clinically significant psychosocial distress or social isolation.          Core Components/Risk Factors/Patient Goals Review:   Goals and Risk Factor Review     Row Name 08/19/23 0831 09/01/23 0931 09/28/23 1725 10/22/23 0935       Core Components/Risk Factors/Patient Goals Review   Personal Goals Review Weight Management/Obesity;Stress;Hypertension;Lipids Weight Management/Obesity;Stress;Hypertension;Lipids Weight Management/Obesity;Stress;Hypertension;Lipids Weight Management/Obesity;Stress;Hypertension;Lipids    Review Cathlyn is off to a good start to exercise at cardiac rehab. Vital signs have been stable. Cathlyn has bee increasing his work loads. Cathlyn is doing well with exercise at cardiac rehab. Vital signs have been stable. Cathlyn has been increasing his work loads.Cathlyn is now jogging on the treadmill. Cathlyn continues to do very  well with exercise at cardiac rehab. Vital signs remain stable. Cathlyn has been increasing his work loads and continues to jog on the treadmill. Cathlyn continues to do very  well with exercise at cardiac rehab.  Vital signs remain stable. Cathlyn has been increasing his work loads and continues to jog on the treadmill. Cathlyn will complete cardiac rehab on 10/27/23.    Expected Outcomes Cathlyn will continue to participate in cardiac rehab for exercise, nutrition and lifestyle modifications Cathlyn will continue to participate in cardiac rehab for exercise, nutrition and lifestyle modifications Cathlyn will continue to participate in cardiac rehab for exercise, nutrition and lifestyle modifications Cathlyn will continue to participate in cardiac rehab for exercise, nutrition and lifestyle modifications       Core Components/Risk Factors/Patient Goals at Discharge (Final Review):   Goals and Risk Factor Review - 10/22/23 0935       Core Components/Risk Factors/Patient Goals Review   Personal Goals Review Weight Management/Obesity;Stress;Hypertension;Lipids    Review Cathlyn continues to do very  well with exercise at cardiac rehab. Vital signs remain stable. Cathlyn has been increasing his work loads and continues to jog on the treadmill. Cathlyn will complete cardiac rehab on 10/27/23.    Expected Outcomes Cathlyn will continue to participate in cardiac rehab for exercise, nutrition and lifestyle modifications          ITP Comments:  ITP Comments     Row Name 08/02/23 1415 08/19/23 0814 09/01/23 0923 09/28/23 1723 10/22/23 0932   ITP Comments Dr. Wilbert Bihari medical director. Introduction to pritikin education/intensive cardiac rehab. Initial orientation packet reviewed with patient. 30 Day ITP Review. Cathlyn started cardiac rehab on 08/11/23. Cathlyn is off to a good start to exercise. 30 Day ITP Review. Cathlyn has good attendance and participation with exercise at cardiac rehab 30 Day ITP Review. Cathlyn continues to have  good attendance and participation with exercise at cardiac rehab 30 Day ITP Review. Cathlyn continues to have  good attendance and participation with exercise at cardiac rehab. Cathlyn will complete cardiac rehab on  10/27/23.      Comments: See ITP comments.Hadassah Elpidio Quan RN BSN

## 2023-10-25 ENCOUNTER — Telehealth: Payer: Self-pay

## 2023-10-25 ENCOUNTER — Encounter (HOSPITAL_COMMUNITY)
Admission: RE | Admit: 2023-10-25 | Discharge: 2023-10-25 | Disposition: A | Discharge status: Home / Self Care | Source: Ambulatory Visit | Attending: Cardiology

## 2023-10-25 DIAGNOSIS — Z955 Presence of coronary angioplasty implant and graft: Secondary | ICD-10-CM

## 2023-10-25 NOTE — Telephone Encounter (Signed)
 Left message about PREP program and asked to return my call.

## 2023-10-25 NOTE — Telephone Encounter (Signed)
 He called to ask about PREP program; finishing cardiac rehab this week, will ask Jetta to send referral, he would like to take next class at [facility] on 8/19. Will await referral and then call to set up assessment visit.

## 2023-10-27 ENCOUNTER — Encounter (HOSPITAL_COMMUNITY)
Admission: RE | Admit: 2023-10-27 | Discharge: 2023-10-27 | Disposition: A | Discharge status: Home / Self Care | Source: Ambulatory Visit | Attending: Cardiology | Admitting: Cardiology

## 2023-10-27 VITALS — Ht 70.0 in | Wt 185.2 lb

## 2023-10-27 DIAGNOSIS — Z955 Presence of coronary angioplasty implant and graft: Secondary | ICD-10-CM | POA: Diagnosis not present

## 2023-10-28 NOTE — Progress Notes (Signed)
 [facility]  Patient Details  Name: Jesus [name] MRN: [id number] Date of Birth: 1965-04-30 Age: 58 y.o. PCP: Teresa Channel, MD  Vitals:   10/28/23 1505  BP: 120/76  Pulse: (!) 51  SpO2: 98%  Weight: 186 lb (84.4 kg)     [facility] Eval - 10/28/23 1500       [facility] PREP Location   [facility]      Referral    Referring Provider --   Cardiac Rehab   Reason for referral --   Cardiac Rehab   Program Start Date 11/02/23      Measurement   Waist Circumference 39.25 inches    Hip Circumference 39.25 inches    Body fat 23.7 percent      Information for Trainer   Goals --   Continue with healthy eating and exercise regime   Current Exercise --   cardiac rehab   Pertinent Medical History --   Heart attack     Mobility and Daily Activities   I find it easy to walk up or down two or more flights of stairs. 4    I have no trouble taking out the trash. 4    I do housework such as vacuuming and dusting on my own without difficulty. 4    I can easily lift a gallon of milk (8lbs). 4    I can easily walk a mile. 4    I have no trouble reaching into high cupboards or reaching down to pick up something from the floor. 4    I do not have trouble doing out-door work such as Loss adjuster, chartered, raking leaves, or gardening. 4      Mobility and Daily Activities   I feel younger than my age. 2    I feel independent. 4    I feel energetic. 2    I live an active life.  2    I feel strong. 2    I feel healthy. 2    I feel active as other people my age. 2      How fit and strong are you.   Fit and Strong Total Score 44         Past Medical History:  Diagnosis Date   Adenomatous colon polyp    Anxiety    Asthma    Depression    Drug-induced myopathy    Hearing loss    Hematuria    Hematuria    HLD (hyperlipidemia)    Hypercholesterolemia    Mild cognitive impairment    OSA (obstructive sleep apnea)    Past Surgical History:  Procedure  Laterality Date   CHOLECYSTECTOMY     CORONARY STENT INTERVENTION [name] 06/28/2023   Procedure: CORONARY STENT INTERVENTION;  Surgeon: Ladona Heinz, MD;  [facility];  Service: Cardiovascular;  Laterality: [name];   HERNIA REPAIR     LEFT HEART CATH AND CORONARY ANGIOGRAPHY [name] 06/28/2023   Procedure: LEFT HEART CATH AND CORONARY ANGIOGRAPHY;  Surgeon: Ladona Heinz, MD;  [facility];  Service: Cardiovascular;  Laterality: [name];   ORIF TIBIA FRACTURE     Social History   Tobacco Use  Smoking Status Never  Smokeless Tobacco Never    [name] 10/28/2023, 3:11 PM

## 2023-10-29 DIAGNOSIS — E785 Hyperlipidemia, unspecified: Secondary | ICD-10-CM | POA: Diagnosis not present

## 2023-10-30 LAB — LIPID PANEL
Chol/HDL Ratio: 2.5 ratio (ref 0.0–5.0)
Cholesterol, Total: 109 mg/dL (ref 100–199)
HDL: 44 mg/dL (ref >39)
LDL Chol Calc (NIH): 49 mg/dL (ref 0–99)
Triglycerides: 80 mg/dL (ref 0–149)
VLDL Cholesterol Cal: 16 mg/dL (ref 5–40)

## 2023-11-01 ENCOUNTER — Ambulatory Visit: Payer: Self-pay | Admitting: Pharmacist Clinician (PhC)/ Clinical Pharmacy Specialist

## 2023-11-02 NOTE — Progress Notes (Signed)
 [facility] Weekly Session  Patient Details  Name: Jesus [name] MRN: [id number] Date of Birth: 10-11-1965 Age: 58 y.o. PCP: Teresa Channel, MD  Vitals:   11/02/23 1601  Weight: 187 lb (84.8 kg)     [facility] Weekly seesion - 11/02/23 1600       [facility] PREP Location   [facility] PREP Location [facility]      Weekly Session   Topic Discussed Goal setting and welcome to the program    Classes attended to date 1          [facility] 11/02/2023, 4:02 PM

## 2023-11-09 NOTE — Progress Notes (Signed)
 [facility] Weekly Session  Patient Details  Name: BASHIR MARCHETTI MRN: [id number] Date of Birth: [birth year] Age: 58 y.o. PCP: Teresa Channel, MD  Vitals:   11/09/23 1519  Weight: 183 lb 12.8 oz (83.4 kg)     [facility] Weekly seesion - 11/09/23 1500       [facility] PREP Location   [facility] PREP Location [facility]      Weekly Session   Topic Discussed Other ways to be active   Talked about the difference between cardio, strength training, and stretching and recommended time amounts.   Classes attended to date 3          [name] 11/09/2023, 3:19 PM

## 2023-11-16 NOTE — Progress Notes (Signed)
 [facility] Weekly Session  Patient Details  Name: Jesus [name] MRN: [id number] Date of Birth: 1965-12-09 Age: 58 y.o. PCP: Teresa Channel, MD  Vitals:   11/16/23 1556  Weight: 183 lb (83 kg)     [facility] Weekly seesion - 11/16/23 1500       [facility] PREP Location   [facility] PREP Location [facility]      Weekly Session   Topic Discussed Healthy eating tips   Talked about foods to increase and avoid including fats and sugar. Went over [employer] exercised this week 170 minutes    Classes attended to date 5          [name] 11/16/2023, 3:57 PM

## 2023-11-30 NOTE — Progress Notes (Signed)
 [facility] Weekly Session  Patient Details  Name: Jesus [name] MRN: [id number] Date of Birth: Jan 05, 1966 Age: 58 y.o. PCP: Teresa Channel, MD  Vitals:   11/30/23 1559  Weight: 181 lb 12.8 oz (82.5 kg)     [facility] Weekly seesion - 11/30/23 1500       [facility] PREP Location   [facility] PREP Location [facility]      Weekly Session   Topic Discussed Health habits   Talked about healthy tips and habits, sugar demo   Minutes exercised this week 150 minutes    Classes attended to date 7          [facility] 11/30/2023, 3:59 PM

## 2023-12-07 NOTE — Progress Notes (Signed)
 [facility] Weekly Session  Patient Details  Name: Jesus [name] MRN: [id number] Date of Birth: 01-01-1966 Age: 58 y.o. PCP: Teresa Channel, MD  Vitals:   12/07/23 1538  Weight: 181 lb (82.1 kg)     [facility] Weekly seesion - 12/07/23 1500       [facility] PREP Location   [facility] PREP Location [facility]      Weekly Session   Topic Discussed Restaurant Eating   Discussed tips for eating out and sodium talk   Minutes exercised this week 225 minutes    Classes attended to date [address]          [name] 12/07/2023, 3:39 PM

## 2023-12-14 NOTE — Progress Notes (Signed)
 [facility] Weekly Session  Patient Details  Name: Jesus [name] MRN: [id number] Date of Birth: 04/04/1965 Age: 58 y.o. PCP: Teresa Channel, MD  Vitals:   12/14/23 1531  Weight: 183 lb 3.2 oz (83.1 kg)     [facility] Weekly seesion - 12/14/23 1500       [facility] PREP Location   [facility]      Weekly Session   Topic Discussed Stress management and problem solving   Stress management and sleep tips   Minutes exercised this week 135 minutes          [name] 12/14/2023, 3:31 PM

## 2023-12-21 NOTE — Progress Notes (Signed)
 [facility] Weekly Session  Patient Details  Name: Jesus [name] MRN: [id number] Date of Birth: Oct 26, 1965 Age: 58 y.o. PCP: Teresa Channel, MD  Vitals:   12/21/23 1521  Weight: 185 lb 6.4 oz (84.1 kg)     [facility] Weekly seesion - 12/21/23 1500       [facility] PREP Location   [facility] PRE[facility]      Weekly Session   Topic Discussed Expectations and non-scale victories   Check in on info we have learned, non scale victories and celebrations, how to keep going   Minutes exercised this week 190 minutes    Classes attended to date [address] 12/21/2023, 3:23 PM

## 2023-12-24 ENCOUNTER — Encounter: Payer: Self-pay | Admitting: Cardiology

## 2023-12-24 ENCOUNTER — Ambulatory Visit: Attending: Cardiology | Admitting: Cardiology

## 2023-12-24 VITALS — BP 146/80 | HR 65 | Resp 16 | Ht 70.0 in | Wt 185.6 lb

## 2023-12-24 DIAGNOSIS — T466X5D Adverse effect of antihyperlipidemic and antiarteriosclerotic drugs, subsequent encounter: Secondary | ICD-10-CM | POA: Diagnosis not present

## 2023-12-24 DIAGNOSIS — E782 Mixed hyperlipidemia: Secondary | ICD-10-CM

## 2023-12-24 DIAGNOSIS — I2510 Atherosclerotic heart disease of native coronary artery without angina pectoris: Secondary | ICD-10-CM

## 2023-12-24 DIAGNOSIS — G720 Drug-induced myopathy: Secondary | ICD-10-CM | POA: Diagnosis not present

## 2023-12-24 DIAGNOSIS — I77810 Thoracic aortic ectasia: Secondary | ICD-10-CM | POA: Diagnosis not present

## 2023-12-24 MED ORDER — VALSARTAN 40 MG PO TABS: 40.0000 mg | ORAL_TABLET | Freq: Every evening | ORAL | 2 refills | Status: AC

## 2023-12-24 MED ORDER — CLOPIDOGREL BISULFATE 75 MG PO TABS: 75.0000 mg | ORAL_TABLET | Freq: Every day | ORAL | 3 refills | Status: AC

## 2023-12-24 NOTE — Patient Instructions (Signed)
 Medication Instructions:  Please discontinue your Losartan  and start Valsartan as directed. Continue all other medications as listed.  *If you need a refill on your cardiac medications before your next appointment, please call your pharmacy*   Follow-Up: At [facility], you and your health needs are our priority.  As part of our continuing mission to provide you with exceptional heart care, our providers are all part of one team.  This team includes your primary Cardiologist (physician) and Advanced Practice Providers or APPs (Physician Assistants and Nurse Practitioners) who all work together to provide you with the care you need, when you need it.  Your next appointment:   1 year(s)  Provider:   Gordy Bergamo, MD    We recommend signing up for the patient portal called MyChart.  Sign up information is provided on this After Visit Summary.  MyChart is used to connect with patients for Virtual Visits (Telemedicine).  Patients are able to view lab/test results, encounter notes, upcoming appointments, etc.  Non-urgent messages can be sent to your provider as well.   To learn more about what you can do with MyChart, go to [email or web address].
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
## 2023-12-28 DIAGNOSIS — G720 Drug-induced myopathy: Secondary | ICD-10-CM | POA: Diagnosis not present

## 2023-12-28 DIAGNOSIS — Z0000 Encounter for general adult medical examination without abnormal findings: Secondary | ICD-10-CM | POA: Diagnosis not present

## 2023-12-28 DIAGNOSIS — J301 Allergic rhinitis due to pollen: Secondary | ICD-10-CM | POA: Diagnosis not present

## 2023-12-28 DIAGNOSIS — G3184 Mild cognitive impairment, so stated: Secondary | ICD-10-CM | POA: Diagnosis not present

## 2023-12-28 DIAGNOSIS — Z23 Encounter for immunization: Secondary | ICD-10-CM | POA: Diagnosis not present

## 2023-12-28 DIAGNOSIS — I2510 Atherosclerotic heart disease of native coronary artery without angina pectoris: Secondary | ICD-10-CM | POA: Diagnosis not present

## 2023-12-28 DIAGNOSIS — F419 Anxiety disorder, unspecified: Secondary | ICD-10-CM | POA: Diagnosis not present

## 2023-12-28 DIAGNOSIS — E785 Hyperlipidemia, unspecified: Secondary | ICD-10-CM | POA: Diagnosis not present

## 2023-12-28 DIAGNOSIS — Z125 Encounter for screening for malignant neoplasm of prostate: Secondary | ICD-10-CM | POA: Diagnosis not present

## 2023-12-28 DIAGNOSIS — G4733 Obstructive sleep apnea (adult) (pediatric): Secondary | ICD-10-CM | POA: Diagnosis not present

## 2023-12-28 DIAGNOSIS — R7301 Impaired fasting glucose: Secondary | ICD-10-CM | POA: Diagnosis not present

## 2023-12-28 NOTE — Progress Notes (Signed)
 [facility] Weekly Session  Patient Details  Name: Jesus [name] MRN: [id number] Date of Birth: 01/04/1966 Age: 58 y.o. PCP: Teresa Channel, MD  Vitals:   12/28/23 1554  Weight: 183 lb 6.4 oz (83.2 kg)     [facility] Weekly seesion - 12/28/23 1500       [facility] PREP Location   [facility] PREP Location [facility]      Weekly Session   Topic Discussed Other   Talk about Portion size demo and craisins demo   Minutes exercised this week 200 minutes    Classes attended to date [address]          [facility] 12/28/2023, 3:56 PM

## 2024-01-02 ENCOUNTER — Encounter: Payer: Self-pay | Admitting: Cardiology

## 2024-01-03 NOTE — Telephone Encounter (Signed)
 fyi

## 2024-01-04 NOTE — Progress Notes (Signed)
 [facility] PREP Weekly Session  Patient Details  Name: Jesus [name] MRN: [id number] Date of Birth: 27-Aug-1965 Age: 58 y.o. PCP: Teresa Channel, MD  Vitals:   01/04/24 1605  Weight: 181 lb (82.1 kg)      [name] 01/04/2024, 4:07 PM

## 2024-01-05 ENCOUNTER — Encounter: Payer: Self-pay | Admitting: Cardiology

## 2024-01-07 DIAGNOSIS — Z713 Dietary counseling and surveillance: Secondary | ICD-10-CM | POA: Diagnosis not present

## 2024-01-07 DIAGNOSIS — I2510 Atherosclerotic heart disease of native coronary artery without angina pectoris: Secondary | ICD-10-CM | POA: Diagnosis not present

## 2024-01-07 DIAGNOSIS — E785 Hyperlipidemia, unspecified: Secondary | ICD-10-CM | POA: Diagnosis not present

## 2024-01-07 DIAGNOSIS — R7301 Impaired fasting glucose: Secondary | ICD-10-CM | POA: Diagnosis not present

## 2024-01-11 NOTE — Progress Notes (Signed)
 [facility] PREP Weekly Session  Patient Details  Name: Jesus [name] MRN: [id number] Date of Birth: November 24, 1965 Age: 58 y.o. PCP: Teresa Channel, MD  Vitals:   01/11/24 1603  Weight: 150 lb (68 kg)     [facility] Weekly seesion - 01/11/24 1600       [facility] PREP Location   [facility] PREP Location [facility]      Weekly Session   Topic Discussed Finding support;Hitting roadblocks   Open talk on how class is now feeling. Hitting road block having support. Shoe the differents in 100 cal food with add sugars   Minutes exercised this week 230 minutes    Classes attended to date 17          [name] 01/11/2024, 4:05 PM

## 2024-01-12 DIAGNOSIS — G4733 Obstructive sleep apnea (adult) (pediatric): Secondary | ICD-10-CM | POA: Diagnosis not present

## 2024-01-18 NOTE — Progress Notes (Signed)
 [facility] PREP Evaluation  Patient Details  Name: Jesus [name] MRN: [id number] Date of Birth: [birth year] Age: 58 y.o. PCP: Teresa Channel, MD  Vitals:   01/18/24 1553  BP: 110/69  Pulse: 78  SpO2: 96%  Weight: 181 lb 9.6 oz (82.4 kg)     [facility] Eval - 01/18/24 1500       [facility] PREP Location   [facility] PREP Location [facility]      Referral    Program Start Date 11/02/23    Program End Date 01/18/24      Measurement   Waist Circumference 37.75 inches    Hip Circumference 37.75 inches    Body fat 26.7 percent      Mobility and Daily Activities   I find it easy to walk up or down two or more flights of stairs. 4    I have no trouble taking out the trash. 4    I do housework such as vacuuming and dusting on my own without difficulty. 4    I can easily lift a gallon of milk (8lbs). 4    I can easily walk a mile. 4    I have no trouble reaching into high cupboards or reaching down to pick up something from the floor. 4    I do not have trouble doing out-door work such as loss adjuster, chartered, raking leaves, or gardening. 4      Mobility and Daily Activities   I feel younger than my age. 1    I feel independent. 4    I feel energetic. 2    I live an active life.  2    I feel strong. 2    I feel healthy. 2    I feel active as other people my age. 2      How fit and strong are you.   Fit and Strong Total Score 43         Past Medical History:  Diagnosis Date   Adenomatous colon polyp    Anxiety    Asthma    Depression    Drug-induced myopathy    Hearing loss    Hematuria    Hematuria    HLD (hyperlipidemia)    Hypercholesterolemia    Mild cognitive impairment    OSA (obstructive sleep apnea)    Past Surgical History:  Procedure Laterality Date   CHOLECYSTECTOMY     CORONARY STENT INTERVENTION [name] 06/28/2023   Procedure: CORONARY STENT INTERVENTION;  Surgeon: Ladona Heinz, MD;  [facility];  Service: Cardiovascular;  Laterality: [name];    HERNIA REPAIR     LEFT HEART CATH AND CORONARY ANGIOGRAPHY [name] 06/28/2023   Procedure: LEFT HEART CATH AND CORONARY ANGIOGRAPHY;  Surgeon: Ladona Heinz, MD;  [facility];  Service: Cardiovascular;  Laterality: [name];   ORIF TIBIA FRACTURE     Social History   Tobacco Use  Smoking Status Never  Smokeless Tobacco Never  [name] completed PREP on November 4 His cardio march went from 432 to 550 His Sit and Stands went from 15 to 19 His arm curls 21 to 27 He went from 186 lbs to 181.6 lbs He lost 1.5 inches on his waist He lost 1.5 inches on his hips   [name] 01/18/2024, 3:55 PM

## 2024-01-24 ENCOUNTER — Encounter: Payer: Self-pay | Admitting: Cardiology

## 2024-01-28 ENCOUNTER — Encounter: Payer: Self-pay | Admitting: Pharmacist Clinician (PhC)/ Clinical Pharmacy Specialist

## 2024-01-28 ENCOUNTER — Other Ambulatory Visit (HOSPITAL_COMMUNITY): Payer: Self-pay

## 2024-01-28 ENCOUNTER — Telehealth: Payer: Self-pay | Admitting: Pharmacy Technician

## 2024-01-28 NOTE — Telephone Encounter (Signed)
   Pharmacy Patient Advocate Encounter   Received notification from [employer] that prior authorization for repatha  is required/requested.   Insurance verification completed.   The patient is insured through [facility].   Per test claim: PA required; PA submitted to above mentioned insurance via [employer] Key/confirmation #/EOC [id number] Status is pending

## 2024-01-28 NOTE — Telephone Encounter (Signed)
 Pharmacy Patient Advocate Encounter  Received notification from [facility] that Prior Authorization for repatha  has been APPROVED from 01/28/24 to 01/27/25   PA #/Case ID/Reference #: [id number]

## 2024-02-01 ENCOUNTER — Other Ambulatory Visit (HOSPITAL_COMMUNITY): Payer: Self-pay

## 2024-02-01 ENCOUNTER — Telehealth: Payer: Self-pay | Admitting: Pharmacy Technician

## 2024-02-01 NOTE — Telephone Encounter (Addendum)
    Patient has two insurances.  Under [employer]:  Under [employer]:   Per cmm [id number]:

## 2024-02-07 ENCOUNTER — Ambulatory Visit: Admitting: Cardiology

## 2024-02-14 ENCOUNTER — Other Ambulatory Visit (HOSPITAL_COMMUNITY): Payer: Self-pay

## 2024-02-14 NOTE — Telephone Encounter (Signed)
 Can you run a test claim on this on Thurs/Fri and see if it goes through?  I'll be out of the office from tomorrow until the 16th.   Let him know if it works.  Thank you!

## 2024-02-16 NOTE — Telephone Encounter (Signed)
 SABRA

## 2024-03-11 ENCOUNTER — Emergency Department (HOSPITAL_BASED_OUTPATIENT_CLINIC_OR_DEPARTMENT_OTHER)

## 2024-03-11 ENCOUNTER — Encounter (HOSPITAL_BASED_OUTPATIENT_CLINIC_OR_DEPARTMENT_OTHER): Payer: Self-pay | Admitting: Emergency Medicine

## 2024-03-11 ENCOUNTER — Emergency Department (HOSPITAL_BASED_OUTPATIENT_CLINIC_OR_DEPARTMENT_OTHER)
Admission: EM | Admit: 2024-03-11 | Discharge: 2024-03-11 | Disposition: A | Discharge status: Home / Self Care | Attending: Emergency Medicine | Admitting: Emergency Medicine

## 2024-03-11 ENCOUNTER — Emergency Department (HOSPITAL_BASED_OUTPATIENT_CLINIC_OR_DEPARTMENT_OTHER): Admitting: Radiology

## 2024-03-11 ENCOUNTER — Other Ambulatory Visit: Payer: Self-pay

## 2024-03-11 DIAGNOSIS — W293XXA Contact with powered garden and outdoor hand tools and machinery, initial encounter: Secondary | ICD-10-CM | POA: Insufficient documentation

## 2024-03-11 DIAGNOSIS — Z7902 Long term (current) use of antithrombotics/antiplatelets: Secondary | ICD-10-CM | POA: Diagnosis not present

## 2024-03-11 DIAGNOSIS — Z7951 Long term (current) use of inhaled steroids: Secondary | ICD-10-CM | POA: Diagnosis not present

## 2024-03-11 DIAGNOSIS — Z7982 Long term (current) use of aspirin: Secondary | ICD-10-CM | POA: Diagnosis not present

## 2024-03-11 DIAGNOSIS — M79661 Pain in right lower leg: Secondary | ICD-10-CM | POA: Diagnosis present

## 2024-03-11 DIAGNOSIS — S8011XA Contusion of right lower leg, initial encounter: Secondary | ICD-10-CM | POA: Insufficient documentation

## 2024-03-11 DIAGNOSIS — L03115 Cellulitis of right lower limb: Secondary | ICD-10-CM | POA: Insufficient documentation

## 2024-03-11 DIAGNOSIS — J45909 Unspecified asthma, uncomplicated: Secondary | ICD-10-CM | POA: Insufficient documentation

## 2024-03-11 LAB — CBC WITH DIFFERENTIAL/PLATELET
Abs Immature Granulocytes: 0.01 K/uL (ref 0.00–0.07)
Basophils Absolute: 0 K/uL (ref 0.0–0.1)
Basophils Relative: 1 %
Eosinophils Absolute: 0.1 K/uL (ref 0.0–0.5)
Eosinophils Relative: 2 %
HCT: 35.3 % — ABNORMAL LOW (ref 39.0–52.0)
Hemoglobin: 12.4 g/dL — ABNORMAL LOW (ref 13.0–17.0)
Immature Granulocytes: 0 %
Lymphocytes Relative: 23 %
Lymphs Abs: 1.1 K/uL (ref 0.7–4.0)
MCH: 34.3 pg — ABNORMAL HIGH (ref 26.0–34.0)
MCHC: 35.1 g/dL (ref 30.0–36.0)
MCV: 97.8 fL (ref 80.0–100.0)
Monocytes Absolute: 0.5 K/uL (ref 0.1–1.0)
Monocytes Relative: 11 %
Neutro Abs: 3 K/uL (ref 1.7–7.7)
Neutrophils Relative %: 63 %
Platelets: 214 K/uL (ref 150–400)
RBC: 3.61 MIL/uL — ABNORMAL LOW (ref 4.22–5.81)
RDW: 11.6 % (ref 11.5–15.5)
WBC: 4.6 K/uL (ref 4.0–10.5)
nRBC: 0 % (ref 0.0–0.2)

## 2024-03-11 LAB — BASIC METABOLIC PANEL WITH GFR
Anion gap: 8 (ref 5–15)
BUN: 19 mg/dL (ref 6–20)
CO2: 29 mmol/L (ref 22–32)
Calcium: 10 mg/dL (ref 8.9–10.3)
Chloride: 104 mmol/L (ref 98–111)
Creatinine, Ser: 0.88 mg/dL (ref 0.61–1.24)
GFR, Estimated: >60 mL/min
Glucose, Bld: 97 mg/dL (ref 70–99)
Potassium: 4 mmol/L (ref 3.5–5.1)
Sodium: 141 mmol/L (ref 135–145)

## 2024-03-11 MED ORDER — CEPHALEXIN 500 MG PO CAPS: 500.0000 mg | ORAL_CAPSULE | Freq: Three times a day (TID) | ORAL | 0 refills | Status: AC

## 2024-03-11 NOTE — Discharge Instructions (Signed)
 Your workup was reassuring.  Ultrasound not show evidence of blood clot but does show a hematoma.  Apply cool compress this will help the swelling go down faster.  Elevate legs when you can as this will also help with reducing the swelling.  X-ray did not show fracture.  You likely have a contusion.  Given the warmth and redness I will send in an antibiotic to treat potential cellulitis.  Follow-up with your primary care doctor for reevaluation.  Return if you have any concerning symptoms.
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
## 2024-06-29 ENCOUNTER — Ambulatory Visit: Admitting: Cardiology
